# Patient Record
Sex: Female | Born: 1980 | Race: White | Hispanic: No | Marital: Married | Smoking: Never smoker
Health system: Southern US, Community
[De-identification: ages and names within clinical notes are randomized; demographics above are authoritative.]

## PROBLEM LIST (undated history)

## (undated) DIAGNOSIS — F5089 Other specified eating disorder: Secondary | ICD-10-CM

## (undated) DIAGNOSIS — O99019 Anemia complicating pregnancy, unspecified trimester: Secondary | ICD-10-CM

## (undated) HISTORY — PX: WISDOM TOOTH EXTRACTION: SHX21

---

## 2002-01-28 ENCOUNTER — Other Ambulatory Visit: Admission: RE | Admit: 2002-01-28 | Discharge: 2002-01-28 | Payer: Self-pay | Admitting: Obstetrics and Gynecology

## 2003-03-03 ENCOUNTER — Other Ambulatory Visit: Admission: RE | Admit: 2003-03-03 | Discharge: 2003-03-03 | Payer: Self-pay | Admitting: Obstetrics and Gynecology

## 2012-05-02 DIAGNOSIS — O99019 Anemia complicating pregnancy, unspecified trimester: Secondary | ICD-10-CM

## 2012-05-02 HISTORY — DX: Anemia complicating pregnancy, unspecified trimester: O99.019

## 2012-07-12 LAB — OB RESULTS CONSOLE ANTIBODY SCREEN: Antibody Screen: NEGATIVE

## 2012-07-12 LAB — OB RESULTS CONSOLE ABO/RH

## 2012-07-12 LAB — OB RESULTS CONSOLE GC/CHLAMYDIA: Gonorrhea: NEGATIVE

## 2012-10-13 ENCOUNTER — Inpatient Hospital Stay (HOSPITAL_COMMUNITY): Admission: AD | Admit: 2012-10-13 | Payer: Self-pay | Source: Ambulatory Visit | Admitting: Obstetrics and Gynecology

## 2013-01-31 ENCOUNTER — Inpatient Hospital Stay (HOSPITAL_COMMUNITY)
Admission: AD | Admit: 2013-01-31 | Discharge: 2013-01-31 | Disposition: A | Discharge status: Home / Self Care | Payer: BC Managed Care – PPO | Source: Ambulatory Visit | Attending: Obstetrics and Gynecology | Admitting: Obstetrics and Gynecology

## 2013-01-31 ENCOUNTER — Encounter (HOSPITAL_COMMUNITY): Payer: Self-pay | Admitting: Anesthesiology

## 2013-01-31 ENCOUNTER — Inpatient Hospital Stay (HOSPITAL_COMMUNITY)
Admission: AD | Admit: 2013-01-31 | Discharge: 2013-02-02 | DRG: 370 | Disposition: A | Discharge status: Home / Self Care | Payer: BC Managed Care – PPO | Source: Ambulatory Visit | Attending: Obstetrics and Gynecology | Admitting: Obstetrics and Gynecology

## 2013-01-31 ENCOUNTER — Encounter (HOSPITAL_COMMUNITY): Payer: Self-pay | Admitting: *Deleted

## 2013-01-31 ENCOUNTER — Inpatient Hospital Stay (HOSPITAL_COMMUNITY): Payer: BC Managed Care – PPO | Admitting: Anesthesiology

## 2013-01-31 ENCOUNTER — Encounter (HOSPITAL_COMMUNITY)
Admission: AD | Disposition: A | Discharge status: Home / Self Care | Payer: Self-pay | Source: Ambulatory Visit | Attending: Obstetrics and Gynecology

## 2013-01-31 DIAGNOSIS — O321XX0 Maternal care for breech presentation, not applicable or unspecified: Principal | ICD-10-CM | POA: Diagnosis present

## 2013-01-31 DIAGNOSIS — D509 Iron deficiency anemia, unspecified: Secondary | ICD-10-CM | POA: Diagnosis present

## 2013-01-31 DIAGNOSIS — O43899 Other placental disorders, unspecified trimester: Secondary | ICD-10-CM | POA: Diagnosis present

## 2013-01-31 DIAGNOSIS — Z22330 Carrier of Group B streptococcus: Secondary | ICD-10-CM

## 2013-01-31 DIAGNOSIS — O479 False labor, unspecified: Secondary | ICD-10-CM | POA: Insufficient documentation

## 2013-01-31 DIAGNOSIS — O34599 Maternal care for other abnormalities of gravid uterus, unspecified trimester: Secondary | ICD-10-CM | POA: Diagnosis present

## 2013-01-31 DIAGNOSIS — D4959 Neoplasm of unspecified behavior of other genitourinary organ: Secondary | ICD-10-CM | POA: Diagnosis present

## 2013-01-31 DIAGNOSIS — O9902 Anemia complicating childbirth: Secondary | ICD-10-CM | POA: Diagnosis present

## 2013-01-31 DIAGNOSIS — D252 Subserosal leiomyoma of uterus: Secondary | ICD-10-CM | POA: Diagnosis present

## 2013-01-31 DIAGNOSIS — O471 False labor at or after 37 completed weeks of gestation: Secondary | ICD-10-CM

## 2013-01-31 DIAGNOSIS — O99892 Other specified diseases and conditions complicating childbirth: Secondary | ICD-10-CM | POA: Diagnosis present

## 2013-01-31 HISTORY — DX: Other specified eating disorder: F50.89

## 2013-01-31 HISTORY — DX: Anemia complicating pregnancy, unspecified trimester: O99.019

## 2013-01-31 LAB — CBC
HCT: 35.4 % — ABNORMAL LOW (ref 36.0–46.0)
Hemoglobin: 11.7 g/dL — ABNORMAL LOW (ref 12.0–15.0)
MCH: 27.7 pg (ref 26.0–34.0)
MCH: 27.6 pg (ref 26.0–34.0)
MCHC: 33.3 g/dL (ref 30.0–36.0)
MCHC: 33.1 g/dL (ref 30.0–36.0)
MCV: 83.2 fL (ref 78.0–100.0)
MCV: 83.5 fL (ref 78.0–100.0)
Platelets: 279 10*3/uL (ref 150–400)
RBC: 4.04 MIL/uL (ref 3.87–5.11)
RBC: 4.24 MIL/uL (ref 3.87–5.11)
RDW: 16.6 % — ABNORMAL HIGH (ref 11.5–15.5)
RDW: 16.8 % — ABNORMAL HIGH (ref 11.5–15.5)
WBC: 14.4 10*3/uL — ABNORMAL HIGH (ref 4.0–10.5)
WBC: 17.6 10*3/uL — ABNORMAL HIGH (ref 4.0–10.5)

## 2013-01-31 LAB — COMPREHENSIVE METABOLIC PANEL
AST: 21 U/L (ref 0–37)
CO2: 22 mEq/L (ref 19–32)
Calcium: 9.5 mg/dL (ref 8.4–10.5)
Creatinine, Ser: 0.91 mg/dL (ref 0.50–1.10)
GFR calc Af Amer: >90 mL/min (ref >90)
GFR calc non Af Amer: 81 mL/min — ABNORMAL LOW (ref >90)
Total Protein: 6.2 g/dL (ref 6.0–8.3)

## 2013-01-31 LAB — URIC ACID: Uric Acid, Serum: 5.7 mg/dL (ref 2.4–7.0)

## 2013-01-31 LAB — RPR: RPR Ser Ql: NONREACTIVE

## 2013-01-31 SURGERY — Surgical Case
Anesthesia: Spinal | Site: Abdomen | Wound class: Clean Contaminated

## 2013-01-31 MED ORDER — EPHEDRINE 5 MG/ML INJ: 10.0000 mg | INTRAVENOUS | Status: DC | PRN

## 2013-01-31 MED ORDER — DIPHENHYDRAMINE HCL 25 MG PO CAPS
25.0000 mg | ORAL_CAPSULE | ORAL | Status: DC | PRN
  Filled 2013-01-31: qty 1

## 2013-01-31 MED ORDER — FENTANYL 2.5 MCG/ML BUPIVACAINE 1/10 % EPIDURAL INFUSION (WH - ANES)
14.0000 mL/h | INTRAMUSCULAR | Status: DC | PRN
  Filled 2013-01-31: qty 125

## 2013-01-31 MED ORDER — DIBUCAINE 1 % RE OINT
1.0000 "application " | TOPICAL_OINTMENT | RECTAL | Status: DC | PRN
  Filled 2013-01-31: qty 28

## 2013-01-31 MED ORDER — LANOLIN HYDROUS EX OINT: 1.0000 "application " | TOPICAL_OINTMENT | CUTANEOUS | Status: DC | PRN

## 2013-01-31 MED ORDER — METOCLOPRAMIDE HCL 5 MG/ML IJ SOLN: 10.0000 mg | Freq: Three times a day (TID) | INTRAMUSCULAR | Status: DC | PRN

## 2013-01-31 MED ORDER — ONDANSETRON HCL 4 MG/2ML IJ SOLN: 4.0000 mg | Freq: Three times a day (TID) | INTRAMUSCULAR | Status: DC | PRN

## 2013-01-31 MED ORDER — DIPHENHYDRAMINE HCL 50 MG/ML IJ SOLN: 25.0000 mg | INTRAMUSCULAR | Status: DC | PRN

## 2013-01-31 MED ORDER — EPHEDRINE SULFATE 50 MG/ML IJ SOLN
INTRAMUSCULAR | Status: DC | PRN
  Administered 2013-01-31: 15 mg via INTRAVENOUS
  Administered 2013-01-31: 10 mg via INTRAVENOUS

## 2013-01-31 MED ORDER — FENTANYL CITRATE 0.05 MG/ML IJ SOLN: 25.0000 ug | INTRAMUSCULAR | Status: DC | PRN

## 2013-01-31 MED ORDER — LIDOCAINE-EPINEPHRINE (PF) 2 %-1:200000 IJ SOLN
INTRAMUSCULAR | Status: DC | PRN
  Administered 2013-01-31: 5 mL via EPIDURAL
  Administered 2013-01-31: 4 mL via EPIDURAL
  Administered 2013-01-31 (×2): 5 mL via EPIDURAL

## 2013-01-31 MED ORDER — SCOPOLAMINE 1 MG/3DAYS TD PT72
1.0000 | MEDICATED_PATCH | Freq: Once | TRANSDERMAL | Status: DC
  Administered 2013-01-31: 1.5 mg via TRANSDERMAL

## 2013-01-31 MED ORDER — SCOPOLAMINE 1 MG/3DAYS TD PT72
MEDICATED_PATCH | TRANSDERMAL | Status: AC
  Administered 2013-01-31: 1.5 mg via TRANSDERMAL
  Filled 2013-01-31: qty 1

## 2013-01-31 MED ORDER — NALBUPHINE HCL 10 MG/ML IJ SOLN
5.0000 mg | INTRAMUSCULAR | Status: DC | PRN
  Filled 2013-01-31: qty 1

## 2013-01-31 MED ORDER — PHENYLEPHRINE 40 MCG/ML (10ML) SYRINGE FOR IV PUSH (FOR BLOOD PRESSURE SUPPORT): 80.0000 ug | PREFILLED_SYRINGE | INTRAVENOUS | Status: DC | PRN

## 2013-01-31 MED ORDER — KETOROLAC TROMETHAMINE 30 MG/ML IJ SOLN
30.0000 mg | Freq: Four times a day (QID) | INTRAMUSCULAR | Status: AC | PRN
  Administered 2013-01-31: 30 mg via INTRAVENOUS

## 2013-01-31 MED ORDER — DIPHENHYDRAMINE HCL 50 MG/ML IJ SOLN: 12.5000 mg | INTRAMUSCULAR | Status: DC | PRN

## 2013-01-31 MED ORDER — MEPERIDINE HCL 25 MG/ML IJ SOLN: 6.2500 mg | INTRAMUSCULAR | Status: DC | PRN

## 2013-01-31 MED ORDER — MENTHOL 3 MG MT LOZG
1.0000 | LOZENGE | OROMUCOSAL | Status: DC | PRN
  Filled 2013-01-31: qty 9

## 2013-01-31 MED ORDER — WITCH HAZEL-GLYCERIN EX PADS: 1.0000 "application " | MEDICATED_PAD | CUTANEOUS | Status: DC | PRN

## 2013-01-31 MED ORDER — LACTATED RINGERS IV SOLN
INTRAVENOUS | Status: DC
  Administered 2013-01-31: 11:00:00 via INTRAVENOUS

## 2013-01-31 MED ORDER — PENICILLIN G POTASSIUM 5000000 UNITS IJ SOLR
2.5000 10*6.[IU] | INTRAVENOUS | Status: DC
  Filled 2013-01-31 (×4): qty 2.5

## 2013-01-31 MED ORDER — SIMETHICONE 80 MG PO CHEW
80.0000 mg | CHEWABLE_TABLET | Freq: Three times a day (TID) | ORAL | Status: DC
  Administered 2013-02-01 – 2013-02-02 (×6): 80 mg via ORAL

## 2013-01-31 MED ORDER — OXYTOCIN BOLUS FROM INFUSION: 500.0000 mL | INTRAVENOUS | Status: DC

## 2013-01-31 MED ORDER — LACTATED RINGERS IV SOLN
INTRAVENOUS | Status: DC | PRN
  Administered 2013-01-31 (×2): via INTRAVENOUS

## 2013-01-31 MED ORDER — KETOROLAC TROMETHAMINE 30 MG/ML IJ SOLN
INTRAMUSCULAR | Status: AC
  Administered 2013-01-31: 30 mg via INTRAVENOUS
  Filled 2013-01-31: qty 1

## 2013-01-31 MED ORDER — SIMETHICONE 80 MG PO CHEW: 80.0000 mg | CHEWABLE_TABLET | ORAL | Status: DC

## 2013-01-31 MED ORDER — OXYTOCIN 40 UNITS IN LACTATED RINGERS INFUSION - SIMPLE MED: 62.5000 mL/h | INTRAVENOUS | Status: DC

## 2013-01-31 MED ORDER — PROMETHAZINE HCL 25 MG/ML IJ SOLN
12.5000 mg | Freq: Once | INTRAMUSCULAR | Status: AC
  Administered 2013-01-31: 12.5 mg via INTRAVENOUS
  Filled 2013-01-31: qty 1

## 2013-01-31 MED ORDER — IBUPROFEN 600 MG PO TABS: 600.0000 mg | ORAL_TABLET | Freq: Four times a day (QID) | ORAL | Status: DC | PRN

## 2013-01-31 MED ORDER — DIPHENHYDRAMINE HCL 25 MG PO CAPS
25.0000 mg | ORAL_CAPSULE | Freq: Four times a day (QID) | ORAL | Status: DC | PRN
  Filled 2013-01-31: qty 1

## 2013-01-31 MED ORDER — ONDANSETRON HCL 4 MG PO TABS: 4.0000 mg | ORAL_TABLET | ORAL | Status: DC | PRN

## 2013-01-31 MED ORDER — CITRIC ACID-SODIUM CITRATE 334-500 MG/5ML PO SOLN
30.0000 mL | ORAL | Status: DC | PRN
  Administered 2013-01-31: 30 mL via ORAL
  Filled 2013-01-31: qty 15

## 2013-01-31 MED ORDER — LACTATED RINGERS IV SOLN: 500.0000 mL | INTRAVENOUS | Status: DC | PRN

## 2013-01-31 MED ORDER — CEFAZOLIN SODIUM-DEXTROSE 2-3 GM-% IV SOLR
2.0000 g | INTRAVENOUS | Status: AC
  Administered 2013-01-31: 2 g via INTRAVENOUS
  Filled 2013-01-31: qty 50

## 2013-01-31 MED ORDER — EPHEDRINE 5 MG/ML INJ
10.0000 mg | INTRAVENOUS | Status: DC | PRN
  Filled 2013-01-31: qty 4

## 2013-01-31 MED ORDER — LACTATED RINGERS IV SOLN: INTRAVENOUS | Status: DC

## 2013-01-31 MED ORDER — 0.9 % SODIUM CHLORIDE (POUR BTL) OPTIME
TOPICAL | Status: DC | PRN
  Administered 2013-01-31: 1000 mL

## 2013-01-31 MED ORDER — OXYCODONE-ACETAMINOPHEN 5-325 MG PO TABS: 1.0000 | ORAL_TABLET | ORAL | Status: DC | PRN

## 2013-01-31 MED ORDER — ONDANSETRON HCL 4 MG/2ML IJ SOLN
INTRAMUSCULAR | Status: DC | PRN
  Administered 2013-01-31: 4 mg via INTRAVENOUS

## 2013-01-31 MED ORDER — PHENYLEPHRINE 40 MCG/ML (10ML) SYRINGE FOR IV PUSH (FOR BLOOD PRESSURE SUPPORT)
80.0000 ug | PREFILLED_SYRINGE | INTRAVENOUS | Status: DC | PRN
  Filled 2013-01-31: qty 5

## 2013-01-31 MED ORDER — IBUPROFEN 600 MG PO TABS
600.0000 mg | ORAL_TABLET | Freq: Four times a day (QID) | ORAL | Status: DC
  Administered 2013-02-01 – 2013-02-02 (×7): 600 mg via ORAL
  Filled 2013-01-31 (×7): qty 1

## 2013-01-31 MED ORDER — ONDANSETRON HCL 4 MG/2ML IJ SOLN: 4.0000 mg | INTRAMUSCULAR | Status: DC | PRN

## 2013-01-31 MED ORDER — PRENATAL MULTIVITAMIN CH
1.0000 | ORAL_TABLET | Freq: Every day | ORAL | Status: DC
  Administered 2013-02-01 – 2013-02-02 (×2): 1 via ORAL
  Filled 2013-01-31 (×3): qty 1

## 2013-01-31 MED ORDER — MORPHINE SULFATE 0.5 MG/ML IJ SOLN
INTRAMUSCULAR | Status: AC
  Filled 2013-01-31: qty 10

## 2013-01-31 MED ORDER — OXYTOCIN 40 UNITS IN LACTATED RINGERS INFUSION - SIMPLE MED: 62.5000 mL/h | INTRAVENOUS | Status: AC

## 2013-01-31 MED ORDER — LACTATED RINGERS IV SOLN
500.0000 mL | Freq: Once | INTRAVENOUS | Status: AC
  Administered 2013-01-31: 500 mL via INTRAVENOUS

## 2013-01-31 MED ORDER — NALOXONE HCL 1 MG/ML IJ SOLN: 1.0000 ug/kg/h | INTRAVENOUS | Status: DC | PRN

## 2013-01-31 MED ORDER — ZOLPIDEM TARTRATE 5 MG PO TABS: 5.0000 mg | ORAL_TABLET | Freq: Every evening | ORAL | Status: DC | PRN

## 2013-01-31 MED ORDER — BUTORPHANOL TARTRATE 1 MG/ML IJ SOLN
2.0000 mg | Freq: Once | INTRAMUSCULAR | Status: AC
  Administered 2013-01-31: 2 mg via INTRAVENOUS
  Filled 2013-01-31: qty 2

## 2013-01-31 MED ORDER — SENNOSIDES-DOCUSATE SODIUM 8.6-50 MG PO TABS
2.0000 | ORAL_TABLET | ORAL | Status: DC
  Administered 2013-02-02: 2 via ORAL

## 2013-01-31 MED ORDER — MORPHINE SULFATE (PF) 0.5 MG/ML IJ SOLN
INTRAMUSCULAR | Status: DC | PRN
  Administered 2013-01-31: 4 mg via EPIDURAL

## 2013-01-31 MED ORDER — ACETAMINOPHEN 325 MG PO TABS: 650.0000 mg | ORAL_TABLET | ORAL | Status: DC | PRN

## 2013-01-31 MED ORDER — NALOXONE HCL 0.4 MG/ML IJ SOLN: 0.4000 mg | INTRAMUSCULAR | Status: DC | PRN

## 2013-01-31 MED ORDER — LIDOCAINE HCL (PF) 1 % IJ SOLN: 30.0000 mL | INTRAMUSCULAR | Status: DC | PRN

## 2013-01-31 MED ORDER — ONDANSETRON HCL 4 MG/2ML IJ SOLN: 4.0000 mg | Freq: Four times a day (QID) | INTRAMUSCULAR | Status: DC | PRN

## 2013-01-31 MED ORDER — LACTATED RINGERS IV SOLN
INTRAVENOUS | Status: DC | PRN
  Administered 2013-01-31: 17:00:00 via INTRAVENOUS

## 2013-01-31 MED ORDER — SODIUM CHLORIDE 0.9 % IJ SOLN: 3.0000 mL | INTRAMUSCULAR | Status: DC | PRN

## 2013-01-31 MED ORDER — KETOROLAC TROMETHAMINE 30 MG/ML IJ SOLN: 30.0000 mg | Freq: Four times a day (QID) | INTRAMUSCULAR | Status: AC | PRN

## 2013-01-31 MED ORDER — SIMETHICONE 80 MG PO CHEW: 80.0000 mg | CHEWABLE_TABLET | ORAL | Status: DC | PRN

## 2013-01-31 MED ORDER — OXYTOCIN 10 UNIT/ML IJ SOLN
40.0000 [IU] | INTRAVENOUS | Status: DC | PRN
  Administered 2013-01-31: 40 [IU] via INTRAVENOUS

## 2013-01-31 MED ORDER — PENICILLIN G POTASSIUM 5000000 UNITS IJ SOLR
5.0000 10*6.[IU] | Freq: Once | INTRAVENOUS | Status: AC
  Administered 2013-01-31: 5 10*6.[IU] via INTRAVENOUS
  Filled 2013-01-31: qty 5

## 2013-01-31 MED ORDER — TETANUS-DIPHTH-ACELL PERTUSSIS 5-2.5-18.5 LF-MCG/0.5 IM SUSP
0.5000 mL | Freq: Once | INTRAMUSCULAR | Status: DC
  Filled 2013-01-31: qty 0.5

## 2013-01-31 MED ORDER — MORPHINE SULFATE (PF) 0.5 MG/ML IJ SOLN
INTRAMUSCULAR | Status: DC | PRN
  Administered 2013-01-31: 1 mg via INTRAVENOUS

## 2013-01-31 SURGICAL SUPPLY — 34 items
BENZOIN TINCTURE PRP APPL 2/3 (GAUZE/BANDAGES/DRESSINGS) ×2 IMPLANT
CLAMP CORD UMBIL (MISCELLANEOUS) IMPLANT
CLOTH BEACON ORANGE TIMEOUT ST (SAFETY) ×2 IMPLANT
CONTAINER PREFILL 10% NBF 15ML (MISCELLANEOUS) IMPLANT
DRAPE LG THREE QUARTER DISP (DRAPES) ×4 IMPLANT
DRSG OPSITE POSTOP 4X10 (GAUZE/BANDAGES/DRESSINGS) ×2 IMPLANT
DURAPREP 26ML APPLICATOR (WOUND CARE) ×2 IMPLANT
ELECT REM PT RETURN 9FT ADLT (ELECTROSURGICAL) ×2
ELECTRODE REM PT RTRN 9FT ADLT (ELECTROSURGICAL) ×1 IMPLANT
EXTRACTOR VACUUM KIWI (MISCELLANEOUS) IMPLANT
EXTRACTOR VACUUM M CUP 4 TUBE (SUCTIONS) IMPLANT
GLOVE BIO SURGEON STRL SZ 6.5 (GLOVE) ×2 IMPLANT
GLOVE BIOGEL PI IND STRL 7.0 (GLOVE) ×1 IMPLANT
GLOVE BIOGEL PI INDICATOR 7.0 (GLOVE) ×1
GOWN PREVENTION PLUS XLARGE (GOWN DISPOSABLE) ×4 IMPLANT
GOWN STRL REIN XL XLG (GOWN DISPOSABLE) ×4 IMPLANT
KIT ABG SYR 3ML LUER SLIP (SYRINGE) IMPLANT
NEEDLE HYPO 25X5/8 SAFETYGLIDE (NEEDLE) IMPLANT
NS IRRIG 1000ML POUR BTL (IV SOLUTION) ×2 IMPLANT
PACK C SECTION WH (CUSTOM PROCEDURE TRAY) ×2 IMPLANT
PAD OB MATERNITY 4.3X12.25 (PERSONAL CARE ITEMS) ×2 IMPLANT
STAPLER VISISTAT 35W (STAPLE) IMPLANT
STRIP CLOSURE SKIN 1/2X4 (GAUZE/BANDAGES/DRESSINGS) ×2 IMPLANT
SUT MON AB 4-0 PS1 27 (SUTURE) ×2 IMPLANT
SUT PLAIN 0 NONE (SUTURE) IMPLANT
SUT PLAIN 2 0 XLH (SUTURE) IMPLANT
SUT VIC AB 0 CT1 36 (SUTURE) ×2 IMPLANT
SUT VIC AB 0 CTX 36 (SUTURE) ×2
SUT VIC AB 0 CTX36XBRD ANBCTRL (SUTURE) ×2 IMPLANT
SUT VIC AB 2-0 CT1 27 (SUTURE) ×1
SUT VIC AB 2-0 CT1 TAPERPNT 27 (SUTURE) ×1 IMPLANT
TOWEL OR 17X24 6PK STRL BLUE (TOWEL DISPOSABLE) ×2 IMPLANT
TRAY FOLEY CATH 14FR (SET/KITS/TRAYS/PACK) IMPLANT
WATER STERILE IRR 1000ML POUR (IV SOLUTION) ×2 IMPLANT

## 2013-01-31 NOTE — H&P (Signed)
  OB ADMISSION/ HISTORY & PHYSICAL:  Admission Date: 01/31/2013 10:34 AM  Admit Diagnosis: onset of labor - latent labor  Elle Vezina is a 32 y.o. female presenting for regular contractions all night - increased intensity this am. Seen x 2 in MAU overnight - no sleep / + nausea.  Prenatal History: G1P0000   EDC : 01/30/2013, Alternate EDD Entry  Prenatal care at [facility]  Primary Ob Provider: Marlinda Mike CNM Prenatal course complicated by IDA of pregnancy  Prenatal Labs: ABO, Rh:  O positive Antibody:  negative Rubella:   Immune RPR:   NR HBsAg:   Negative HIV:   NR GTT: NL GBS: Positive (10/02 0000)   Medical / Surgical History :  Past medical history:  Past Medical History  Diagnosis Date  . Anemia during pregnancy 2014     Past surgical history:  Past Surgical History  Procedure Laterality Date  . No past surgeries      Family History: No family history on file.   Social History:  reports that she has never smoked. She does not have any smokeless tobacco history on file. She reports that she does not drink alcohol or use illicit drugs.  Allergies: Review of patient's allergies indicates no known allergies.   Current Medications at time of admission:  Prior to Admission medications   Medication Sig Start Date End Date Taking? Authorizing Provider  Cholecalciferol (VITAMIN D PO) Take 1 tablet by mouth.    Historical Provider, MD  Iron Polysacch Cmplx-B12-FA (POLY-IRON 150 FORTE PO) Take 1 tablet by mouth.    Historical Provider, MD  MAGNESIUM OXIDE PO Take 1 tablet by mouth.    Historical Provider, MD  Prenatal Vit-Fe Fumarate-FA (PRENATAL MULTIVITAMIN) TABS tablet Take 1 tablet by mouth daily at 12 noon.    Historical Provider, MD    Review of Systems: Active FM onset of ctx yesterday and continued all night -  currently every 5-6 minutes SROM upon arrival to BS bloody show small brown mucus  Physical Exam:  VS: 98.5 -  63-20-138/70  General: alert and oriented, appears uncomfortable with ctx - breathing with ctx Heart: RRR Lungs: Clear lung fields Abdomen: Gravid, soft and non-tender, non-distended / uterus: gravid and non-tender Extremities: no  edema  Genitalia / VE:  3cm / 90% / vtx / 0 station / BBOW (at office)  FHR: baseline rate 135 / variability moderate / accelerations + / no decelerations TOCO: 5 minutes  Assessment: 40.[redacted] weeks gestation latent stage of labor with POS GBS FHR category 1  Plan:  Admit PCN protocol for GBS IV analgesia - stadol 2mg  and phenergan 12.5 Encouraged rest/sleep for 1-2 hours then will recheck status  Dr Ernestina Penna notified of admission / plan of care   Marlinda Mike CNM, MSN, [facility] 01/31/2013, 10:45 AM

## 2013-01-31 NOTE — MAU Note (Signed)
contractions 

## 2013-01-31 NOTE — Progress Notes (Signed)
  S:  Rested since IV analgesia - comfortable now  O:  VS: Blood pressure 115/54, pulse 56, temperature 98.2 F (36.8 C), temperature source Oral, resp. rate 20, height 5\' 6"  (1.676 m), weight 83.915 kg (185 lb).        FHR : baseline 135 / variability moderate / accelerations + / no decelerations        Toco: contractions every 6-9 minutes / moderrate         Cervix : 3/ 90% / not cephalic - question frank breech / 0 station         Membranes: light meconium        Bedside sono: breech with cephalic prominence in right upper quadrant - left sacral anterior                                cardiac activity right upper above umbilicus / subjectively low AF volume  A: latent labor     FHR category 1  P: consult with Dr Ernestina Penna - breech in latent labor      Plan for cesarean delivery   Erika Jennings CNM, MSN, [facility] 01/31/2013, 2:38 PM

## 2013-01-31 NOTE — Transfer of Care (Signed)
Immediate Anesthesia Transfer of Care Note  Patient: Erika Jennings  Procedure(s) Performed: Procedure(s): CESAREAN SECTION (N/A)  Patient Location: PACU  Anesthesia Type:Epidural  Level of Consciousness: awake, alert  and oriented  Airway & Oxygen Therapy: Patient Spontanous Breathing  Post-op Assessment: Report given to PACU RN and Post -op Vital signs reviewed and stable  Post vital signs: Reviewed and stable  Complications: No apparent anesthesia complications

## 2013-01-31 NOTE — Anesthesia Procedure Notes (Signed)
Epidural Patient location during procedure: OB Start time: 01/31/2013 3:44 PM  Staffing Anesthesiologist: Jakobe Blau A. Performed by: anesthesiologist   Preanesthetic Checklist Completed: patient identified, site marked, surgical consent, pre-op evaluation, timeout performed, IV checked, risks and benefits discussed and monitors and equipment checked  Epidural Patient position: sitting Prep: site prepped and draped and DuraPrep Patient monitoring: continuous pulse ox and blood pressure Approach: midline Injection technique: LOR air  Needle:  Needle type: Tuohy  Needle gauge: 17 G Needle length: 9 cm and 9 Needle insertion depth: 4 cm Catheter type: closed end flexible Catheter size: 19 Gauge Catheter at skin depth: 9 cm Test dose: negative and 2% lidocaine with Epi 1:200 K  Assessment Events: blood not aspirated, injection not painful, no injection resistance, negative IV test and no paresthesia  Additional Notes Patient identified. Risks and benefits discussed including failed block, incomplete  Pain control, post dural puncture headache, nerve damage, paralysis, blood pressure Changes, nausea, vomiting, reactions to medications-both toxic and allergic and post Partum back pain. All questions were answered. Patient expressed understanding and wished to proceed. Sterile technique was used throughout procedure. Epidural site was Dressed with sterile barrier dressing. No paresthesias, signs of intravascular injection Or signs of intrathecal spread were encountered.  Patient was more comfortable after the epidural was dosed. Please see RN's note for documentation of vital signs and FHR which are stable.

## 2013-01-31 NOTE — Op Note (Signed)
01/31/2013  5:23 PM  PATIENT:  Erika Jennings  32 y.o. female  PRE-OPERATIVE DIAGNOSIS:  breech in labor  POST-OPERATIVE DIAGNOSIS:  breech in labor   PROCEDURE:  Procedure(s): CESAREAN SECTION (N/A) LTCS w/ 2 layer closure  SURGEON:  Surgeon(s) and Role:    * Lynnann Knudsen A. Ernestina Penna, MD - Primary  PHYSICIAN ASSISTANT:   ASSISTANTS: Marlinda Mike, CNM   ANESTHESIA:   epidural  EBL:  Total I/O In: 800 [I.V.:800] Out: 750 [Urine:150; Blood:600]  BLOOD ADMINISTERED:none  DRAINS: Urinary Catheter (Foley)   LOCAL MEDICATIONS USED:  NONE  SPECIMEN:  Source of Specimen:  placenta  DISPOSITION OF SPECIMEN:  L&D  COUNTS:  YES  TOURNIQUET:  * No tourniquets in log *  DICTATION: .Note written in EPIC  PLAN OF CARE: Admit to inpatient   PATIENT DISPOSITION:  PACU - hemodynamically stable.   Delay start of Pharmacological VTE agent (>24hrs) due to surgical blood loss or risk of bleeding: yes   Findings:  @BABYSEXEBC @ infant,  APGAR (1 MIN): 5   APGAR (5 MINS): 9   APGAR (10 MINS):    Footling breech, thick meconium, bilobed placenta w/ calcifications, 2 cm subserosal anterior placenta, nl uterus, nl tubes, ovaries  EBL: per anasthesia Antibiotics:  2g Ancef  Complications: none  Indications: This is a 32 y.o. year-old, G1  At [redacted]w[redacted]d admitted for labor. SROM while in labor. After SROM, repeat cervical exam suspicous for breech. RTUS confirmed breech presentation and decision made to proceed to OR for PCS. Risks benefits and alternatives of the procedure were discussed with the patient who agreed to proceed  Procedure:  After informed consent was obtained the patient was taken to the operating room where epidural anesthesia was found to be adequate.  She was prepped and draped in the normal sterile fashion in dorsal supine position with a leftward tilt.  A foley catheter was in place.  A Pfannenstiel skin incision was made 2 cm above the pubic symphysis in the midline with the  scalpel.  Dissection was carried down with the Bovie cautery until the fascia was reached. The fascia was incised in the midline. The incision was extended laterally with the Mayo scissors. The inferior aspect of the fascial incision was grasped with the Coker clamps, elevated up and the underlying rectus muscles were dissected off sharply. The superior aspect of the fascial incision was grasped with the Coker clamps elevated up and the underlying rectus muscles were dissected off sharply.  The peritoneum was entered bluntly. The peritoneal incision was extended superiorly and inferiorly with good visualization of the bladder. The bladder blade was inserted and palpation was done to assess the fetal position and the location of the uterine vessels. The lower segment of the uterus was incised sharply with the scalpel and extended superiorly and laterally with the bandage scissors. The infant's foot was grasped, brought to the incision. Second foot grasped and baby elevated, delivered to shoulders. L shoulder delivered, baby rotated and R shoulder delivered by sweeping arm across chest. Head delivered in flexion.  The nose and mouth were bulb suctioned. Thick meconium noted and cord was clamped and cut and infant was handed off to the waiting pediatrician for suctioning. The placenta was expressed. The uterus was exteriorized. The uterus was cleared of all clots and debris. The uterine incision was repaired with 0 Vicryl in a running locked fashion.  A second layer of the same suture was used in an imbricating fashion to obtain excellent hemostasis.  The  uterus was then returned to the abdomen, the gutters were cleared of all clots and debris. The uterine incision was reinspected and found to be hemostatic. The peritoneum was grasped and closed with 2-0 Vicryl in a running fashion. The cut muscle edges and the underside of the fascia were inspected and found to be hemostatic. The fascia was closed with 0 Vicryl in two  halves . The subcutaneous tissue was irrigated. Scarpa's layer was closed with a 2-0 plain gut suture. The skin was closed with a 4-0 Monocryl in a single layer. The patient tolerated the procedure well. Sponge lap and needle counts were correct x3 and patient was taken to the recovery room in a stable condition.  Georgeanna Radziewicz A. 01/31/2013 5:24 PM

## 2013-01-31 NOTE — Anesthesia Preprocedure Evaluation (Signed)
Anesthesia Evaluation  Patient identified by MRN, date of birth, ID band Patient awake    Reviewed: Allergy & Precautions, H&P , NPO status , Patient's Chart, lab work & pertinent test results  History of Anesthesia Complications (+) MALIGNANT HYPERTHERMIA  Airway Mallampati: III TM Distance: >3 FB Neck ROM: Full    Dental no notable dental hx. (+) Teeth Intact   Pulmonary neg pulmonary ROS,  breath sounds clear to auscultation  Pulmonary exam normal       Cardiovascular negative cardio ROS  Rhythm:Regular Rate:Normal     Neuro/Psych PSYCHIATRIC DISORDERS Pica negative neurological ROS     GI/Hepatic Neg liver ROS, GERD-  Medicated and Controlled,  Endo/Other  negative endocrine ROS  Renal/GU negative Renal ROS  negative genitourinary   Musculoskeletal negative musculoskeletal ROS (+)   Abdominal   Peds  Hematology negative hematology ROS (+)   Anesthesia Other Findings   Reproductive/Obstetrics (+) Pregnancy Breech presentation Term                           Anesthesia Physical Anesthesia Plan  ASA: II and emergent  Anesthesia Plan: Spinal   Post-op Pain Management:    Induction:   Airway Management Planned: Natural Airway  Additional Equipment:   Intra-op Plan:   Post-operative Plan:   Informed Consent: I have reviewed the patients History and Physical, chart, labs and discussed the procedure including the risks, benefits and alternatives for the proposed anesthesia with the patient or authorized representative who has indicated his/her understanding and acceptance.   Dental advisory given  Plan Discussed with: Anesthesiologist, CRNA and Surgeon  Anesthesia Plan Comments:         Anesthesia Quick Evaluation

## 2013-01-31 NOTE — Anesthesia Postprocedure Evaluation (Signed)
  Anesthesia Post-op Note  Patient: Erika Jennings  Procedure(s) Performed: Procedure(s): CESAREAN SECTION (N/A)  Patient Location: PACU  Anesthesia Type:Epidural  Level of Consciousness: awake, alert  and oriented  Airway and Oxygen Therapy: Patient Spontanous Breathing  Post-op Pain: none  Post-op Assessment: Post-op Vital signs reviewed, Patient's Cardiovascular Status Stable, Respiratory Function Stable, Patent Airway, No signs of Nausea or vomiting, Pain level controlled, No headache and No backache  Post-op Vital Signs: Reviewed and stable  Complications: No apparent anesthesia complications

## 2013-01-31 NOTE — Brief Op Note (Signed)
01/31/2013  5:23 PM  PATIENT:  Erika Jennings  32 y.o. female  PRE-OPERATIVE DIAGNOSIS:  breech in labor  POST-OPERATIVE DIAGNOSIS:  breech in labor   PROCEDURE:  Procedure(s): CESAREAN SECTION (N/A) LTCS w/ 2 layer closure  SURGEON:  Surgeon(s) and Role:    * Tyasia Packard A. Ernestina Penna, MD - Primary  PHYSICIAN ASSISTANT:   ASSISTANTS: Marlinda Mike, CNM   ANESTHESIA:   epidural  EBL:  Total I/O In: 800 [I.V.:800] Out: 750 [Urine:150; Blood:600]  BLOOD ADMINISTERED:none  DRAINS: Urinary Catheter (Foley)   LOCAL MEDICATIONS USED:  NONE  SPECIMEN:  Source of Specimen:  placenta  DISPOSITION OF SPECIMEN:  L&D  COUNTS:  YES  TOURNIQUET:  * No tourniquets in log *  DICTATION: .Note written in EPIC  PLAN OF CARE: Admit to inpatient   PATIENT DISPOSITION:  PACU - hemodynamically stable.   Delay start of Pharmacological VTE agent (>24hrs) due to surgical blood loss or risk of bleeding: yes

## 2013-01-31 NOTE — MAU Note (Signed)
Contractions every 4 minutes since 3am this morning. Some bloody discharge. Denies LOF. Positive fetal movement.

## 2013-02-01 ENCOUNTER — Encounter (HOSPITAL_COMMUNITY): Payer: Self-pay | Admitting: Obstetrics

## 2013-02-01 LAB — CBC
HCT: 24.5 % — ABNORMAL LOW (ref 36.0–46.0)
Hemoglobin: 8.2 g/dL — ABNORMAL LOW (ref 12.0–15.0)
MCHC: 33.5 g/dL (ref 30.0–36.0)
RBC: 2.9 MIL/uL — ABNORMAL LOW (ref 3.87–5.11)
WBC: 13.3 10*3/uL — ABNORMAL HIGH (ref 4.0–10.5)

## 2013-02-01 NOTE — Progress Notes (Signed)
Seen and agree -  Frady Taddeo CNM, MSN, FACNM 02/01/2013 @ 1006 am 

## 2013-02-01 NOTE — Lactation Note (Signed)
This note was copied from the chart of Erika Jennings. Lactation Consultation Note  Patient Name: Erika Jennings Date: 02/01/2013  Mom reports baby is nursing well, denies questions or concerns. Basics reviewed. Encouraged to continue to BF with feeding ques, at least every 3 hours. Lactation brochure left for review. Advised of OP services and support group. Advised to call for assist as needed.    Maternal Data    Feeding Feeding Type: Breast Milk  LATCH Score/Interventions Latch: Grasps breast easily, tongue down, lips flanged, rhythmical sucking.  Audible Swallowing: Spontaneous and intermittent  Type of Nipple: Everted at rest and after stimulation  Comfort (Breast/Nipple): Soft / non-tender     Hold (Positioning): No assistance needed to correctly position infant at breast.  LATCH Score: 10  Lactation Tools Discussed/Used     Consult Status      Erika Jennings 02/01/2013, 1:50 PM

## 2013-02-01 NOTE — Anesthesia Postprocedure Evaluation (Signed)
  Anesthesia Post-op Note  Patient: Erika Jennings  Procedure(s) Performed: Procedure(s): CESAREAN SECTION (N/A)  Patient Location: Mother/Baby  Anesthesia Type:Epidural  Level of Consciousness: awake, alert  and oriented  Airway and Oxygen Therapy: Patient Spontanous Breathing  Post-op Pain: mild  Post-op Assessment: Post-op Vital signs reviewed, Patient's Cardiovascular Status Stable, No headache, No backache, No residual numbness and No residual motor weakness  Post-op Vital Signs: Reviewed and stable  Complications: No apparent anesthesia complications

## 2013-02-01 NOTE — Progress Notes (Signed)
PPD # 1 s/p Primary LTCS for breech presentation  Subjective  Pt reports feeling well.  Pain controlled with Motrin.  Tolerating PO Catheter removed this AM.  Not yet voided.  No BM.  Passing flatus. Activity: Up ad lib and ambulatory Breastfeeding well   Objective  VS: T 97.28F BP 113/67 P 63 R18  Labs   10/2 10/2 10/3  0500 1125 0555 Hgb  11.2 11.7 8.2 Hct  33.6 35.4 24.5 Plts  258 279 223  WBC  14.4 17.6 13.3  I&O:  10/2 In 862.5 mL; Out 2400 mL  Net -1537.5 mL  Blood type: O pos  Physical Exam:  General: Alert, cooperative, no distress  CV: RRR  Resp: CTAB  Abdomen: Soft, nontender  Incision: Well approximated w/ suture.  No bleeding.  Tegaderm and honeycomb dressing clean and intact. Uterus: Firm U/U  Perineum: No edema Lochia: Scant rubra, no clots  Ext: +1 edema bilaterally, no erythema, no pain, Neg Homens   A/P:  1.) PPD 1 s/p Primrary LTCS  Continue routine PP orders  Anticipate d/c tomorrow or Sunday  2.) Anemia  Continue PNV & iron supplementation   Georga Bora, SNM

## 2013-02-02 MED ORDER — MEASLES, MUMPS & RUBELLA VAC ~~LOC~~ INJ
0.5000 mL | INJECTION | Freq: Once | SUBCUTANEOUS | Status: AC
  Administered 2013-02-02: 0.5 mL via SUBCUTANEOUS
  Filled 2013-02-02: qty 0.5

## 2013-02-02 MED ORDER — INFLUENZA VAC SPLIT QUAD 0.5 ML IM SUSP
0.5000 mL | Freq: Once | INTRAMUSCULAR | Status: AC
  Administered 2013-02-02: 0.5 mL via INTRAMUSCULAR
  Filled 2013-02-02: qty 0.5

## 2013-02-02 MED ORDER — INFLUENZA VAC SPLIT QUAD 0.5 ML IM SUSP: 0.5000 mL | INTRAMUSCULAR | Status: DC

## 2013-02-02 MED ORDER — IBUPROFEN 600 MG PO TABS: 600.0000 mg | ORAL_TABLET | Freq: Four times a day (QID) | ORAL | Status: DC

## 2013-02-02 NOTE — Discharge Summary (Signed)
POSTOPERATIVE DISCHARGE SUMMARY:  Patient ID: Erika Jennings MRN: [id number] DOB/AGE: 1981-01-28 32 y.o.  Admit date: 01/31/2013 Admission Diagnoses: 40 weeks / labor onset / breech  Discharge date:  02/02/2013 Discharge Diagnoses: POD 2 s/p cesarean section for breech in labor  Prenatal history: G1P1001   EDC : 01/30/2013, Alternate EDD Entry  Prenatal care at [facility]  Primary provider : Marlinda Mike CNm Prenatal course complicated by IDA with PICA  Prenatal Labs: ABO, Rh: --/--/O POS (10/03 0500)  Antibody: Negative (03/13 0000) Rubella:   indeterminate ( MMR booster given) RPR: NON REACTIVE (10/02 1125)  HBsAg: Negative (03/13 0000)  HIV: Non-reactive (03/13 0000)  GTT : NL GBS: Positive (10/02 0000)   Medical / Surgical History :  Past medical history:  Past Medical History  Diagnosis Date  . Anemia during pregnancy 2014  . Pica     Past surgical history:  Past Surgical History  Procedure Laterality Date  . Wisdom tooth extraction    . Cesarean section N/A 01/31/2013    Procedure: CESAREAN SECTION;  Surgeon: Tresa Endo A. Ernestina Penna, MD;  Location: [facility] OR[facility];  Service: Obstetrics;  Laterality: N/A;    Family History: History reviewed. No pertinent family history.  Social History:  reports that she has never smoked. She does not have any smokeless tobacco history on file. She reports that she does not drink alcohol or use illicit drugs.  Allergies: Review of patient's allergies indicates no known allergies.   Current Medications at time of admission:  Prior to Admission medications   Medication Sig Start Date End Date Taking? Authorizing Provider  Cholecalciferol (VITAMIN D PO) Take 2 tablets by mouth.    Yes Historical Provider, MD  Iron Polysacch Cmplx-B12-FA (POLY-IRON 150 FORTE PO) Take 1 tablet by mouth.   Yes Historical Provider, MD  MAGNESIUM OXIDE PO Take 1 tablet by mouth.   Yes Historical Provider, MD  Prenatal Vit-Fe Fumarate-FA (PRENATAL  MULTIVITAMIN) TABS tablet Take 2 tablets by mouth daily at 12 noon.    Yes Historical Provider, MD  ibuprofen (ADVIL,MOTRIN) 600 MG tablet Take 1 tablet (600 mg total) by mouth every 6 (six) hours. 02/02/13   Marlinda Mike, CNM     Intrapartum Course:  Admit for onset of labor with labor progression to 4cm dilation  Breech presentation identified with SROM  Pain management: IV analgesia  CS delivery recommended  Procedures: Cesarean section delivery on 01/31/2013 with delivery of  female newborn by Dr Ernestina Penna   See operative report for further details APGAR (1 MIN): 5   APGAR (5 MINS): 9    Postoperative / postpartum course:  Uncomplicated with discharge on POD 2   Physical Exam:   VSS: Temp:  [97.3 F (36.3 C)-97.6 F (36.4 C)] 97.5 F (36.4 C) (10/04 0600) Pulse Rate:  [54-60] 57 (10/04 0600) Resp:  [16-18] 18 (10/04 0600) BP: (118-135)/(66-72) 135/72 mmHg (10/04 0600) SpO2:  [98 %] 98 % (10/03 1618)  LABS:  Recent Labs  01/31/13 1125 02/01/13 0555  WBC 17.6* 13.3*  HGB 11.7* 8.2*  PLT 279 223    General: ambulatory / NAD or pain Heart: RRR Lungs: clear  Abdomen: soft and non-tender / non-distended / active BS  Extremities: edema / neg Homans  Dressing: intact honeycomb dressing Incision:  approximated with subcuticular / no erythema / no ecchymosis / no drainage  Discharge Instructions:  Discharged Condition: stable  Activity: pelvic rest and postoperative restrictions x 2   Diet: routine  Medications:  Medication List         ibuprofen 600 MG tablet  Commonly known as:  ADVIL,MOTRIN  Take 1 tablet (600 mg total) by mouth every 6 (six) hours.     MAGNESIUM OXIDE PO  Take 1 tablet by mouth.     POLY-IRON 150 FORTE PO  Take 1 tablet by mouth.     prenatal multivitamin Tabs tablet  Take 2 tablets by mouth daily at 12 noon.     VITAMIN D PO  Take 2 tablets by mouth.        Wound Care: keep clean and dry / remove honeycomb POD  5 Postpartum Instructions: [facility] discharge booklet - instructions reviewed  Discharge to: Home  Follow up :   [facility] in 6 weeks for routine postpartum visit with Dawson Hollman Fredric Mare CNM                Signed: Marlinda Mike CNM, MSN, [facility] 02/02/2013, 12:18 PM

## 2013-02-02 NOTE — Progress Notes (Signed)
POSTOPERATIVE DAY # 2 S/P primary LTCS for breech presentation   S:         Reports feeling well.  Requesting d/c today.             Tolerating po intake / no nausea / no vomiting / passing flatus / no BM             Bleeding is spotting             Pain controlled withmotrin             Up ad lib / ambulatory/ voiding QS  Newborn breast feeding    O:  VS: BP 135/72  Pulse 57  Temp(Src) 97.5 F (36.4 C) (Axillary)  Resp 18  Ht 5\' 6"  (1.676 m)  Wt 83.915 kg (185 lb)  BMI 29.87 kg/m2  SpO2 98%   LABS:               Recent Labs  01/31/13 1125 02/01/13 0555  WBC 17.6* 13.3*  HGB 11.7* 8.2*  PLT 279 223               Bloodtype: --/--/O POS (10/03 0500)  Rubella:                                               I&O: Net -1800 mL             Physical Exam:             Alert and Oriented X3  Lungs: Clear and unlabored  Heart: regular rate and rhythm / no mumurs  Abdomen: soft, non-tender, non-distended, +BS             Fundus: firm, non-tender, U/U             Dressing: Honeycomb and tegaderm dressing dry and intact              Incision:  approximated with sutures / no erythema / no ecchymosis / no drainage  Perineum: intact, not swollen  Lochia: Scant rubra, no clots  Extremities: +1 edema, no calf pain or tenderness, no Homans  A/P:      1.) POD # 2 S/P primary LTCS--Pt doing well  Routine postoperative care   Anticipate d/c this afternoon  2.) Anemia  Continue PNV and iron PP    Cyndee Brightly SNM 02/02/2013, 11:14 AM

## 2013-02-02 NOTE — Progress Notes (Signed)
Will continue Niferex 150 and magnesium supplements at discharge - continue for 6 weeks / repeat cbc and ferritin at 6 week DC  - [facility] booklet / instructions reviewed  Marlinda Mike CNM, MSN, [facility] 02/02/2013 @ 1215pm

## 2014-03-03 ENCOUNTER — Encounter (HOSPITAL_COMMUNITY): Payer: Self-pay | Admitting: Obstetrics

## 2015-09-09 LAB — OB RESULTS CONSOLE HIV ANTIBODY (ROUTINE TESTING): HIV: NONREACTIVE

## 2015-09-09 LAB — OB RESULTS CONSOLE ANTIBODY SCREEN: Antibody Screen: NEGATIVE

## 2015-09-09 LAB — OB RESULTS CONSOLE HEPATITIS B SURFACE ANTIGEN: HEP B S AG: NEGATIVE

## 2015-09-09 LAB — OB RESULTS CONSOLE GC/CHLAMYDIA
Chlamydia: NEGATIVE
Gonorrhea: NEGATIVE

## 2015-09-09 LAB — OB RESULTS CONSOLE RUBELLA ANTIBODY, IGM: Rubella: IMMUNE

## 2015-09-09 LAB — OB RESULTS CONSOLE RPR: RPR: NONREACTIVE

## 2015-09-09 LAB — OB RESULTS CONSOLE ABO/RH: RH TYPE: POSITIVE

## 2016-03-09 ENCOUNTER — Other Ambulatory Visit: Payer: Self-pay | Admitting: Obstetrics

## 2016-03-16 ENCOUNTER — Encounter (HOSPITAL_COMMUNITY): Payer: Self-pay | Admitting: *Deleted

## 2016-03-16 ENCOUNTER — Telehealth (HOSPITAL_COMMUNITY): Payer: Self-pay | Admitting: *Deleted

## 2016-03-16 NOTE — Telephone Encounter (Signed)
Preadmission screen  

## 2016-03-17 ENCOUNTER — Encounter (HOSPITAL_COMMUNITY): Payer: Self-pay

## 2016-03-17 ENCOUNTER — Other Ambulatory Visit: Payer: Self-pay | Admitting: Respiratory Therapy

## 2016-03-18 ENCOUNTER — Other Ambulatory Visit: Payer: Self-pay | Admitting: Certified Nurse Midwife

## 2016-03-18 ENCOUNTER — Ambulatory Visit
Admission: RE | Admit: 2016-03-18 | Discharge: 2016-03-18 | Disposition: A | Discharge status: Home / Self Care | Payer: BLUE CROSS/BLUE SHIELD | Source: Ambulatory Visit | Attending: Certified Nurse Midwife | Admitting: Certified Nurse Midwife

## 2016-03-18 DIAGNOSIS — R053 Chronic cough: Secondary | ICD-10-CM

## 2016-03-18 DIAGNOSIS — R05 Cough: Secondary | ICD-10-CM

## 2016-03-23 ENCOUNTER — Encounter (HOSPITAL_COMMUNITY)
Admission: RE | Admit: 2016-03-23 | Discharge: 2016-03-23 | Disposition: A | Discharge status: Home / Self Care | Payer: BLUE CROSS/BLUE SHIELD | Source: Ambulatory Visit | Attending: Obstetrics and Gynecology | Admitting: Obstetrics and Gynecology

## 2016-03-23 LAB — CBC
HCT: 37.3 % (ref 36.0–46.0)
Hemoglobin: 12.9 g/dL (ref 12.0–15.0)
MCH: 30.5 pg (ref 26.0–34.0)
MCHC: 34.6 g/dL (ref 30.0–36.0)
MCV: 88.2 fL (ref 78.0–100.0)
PLATELETS: 276 10*3/uL (ref 150–400)
RBC: 4.23 MIL/uL (ref 3.87–5.11)
RDW: 14.1 % (ref 11.5–15.5)
WBC: 10.9 10*3/uL — ABNORMAL HIGH (ref 4.0–10.5)

## 2016-03-23 LAB — TYPE AND SCREEN
ABO/RH(D): O POS
Antibody Screen: NEGATIVE

## 2016-03-23 NOTE — Patient Instructions (Addendum)
[place]  03/23/2016   Your procedure is scheduled on:  03/26/2016  Enter through the Maternity Admissions of [facility] at [place] AM.  .   Call this number if you have problems the morning of surgery: [phone]   Remember:   Do not eat food:After Midnight.  Do not drink clear liquids: After Midnight.  Take these medicines the morning of surgery with A SIP OF WATER: none   Do not wear jewelry, make-up or nail polish.  Do not wear lotions, powders, or perfumes. Do not wear deodorant.  Do not shave 48 hours prior to surgery.  Do not bring valuables to the hospital.  [facility] is not   responsible for any belongings or valuables brought to the hospital.  Contacts, dentures or bridgework may not be worn into surgery.  Leave suitcase in the car. After surgery it may be brought to your room.  For patients admitted to the hospital, checkout time is 11:00 AM the day of              discharge.   Patients discharged the day of surgery will not be allowed to drive             home.  Name and phone number of your driver: na  Special Instructions:   N/A   Please read over the following fact sheets that you were given:   Surgical Site Infection Prevention

## 2016-03-24 LAB — RPR: RPR: NONREACTIVE

## 2016-03-25 ENCOUNTER — Other Ambulatory Visit (HOSPITAL_COMMUNITY): Payer: Self-pay | Admitting: Obstetrics

## 2016-03-25 ENCOUNTER — Encounter (HOSPITAL_COMMUNITY): Admission: RE | Admit: 2016-03-25 | Payer: BLUE CROSS/BLUE SHIELD | Source: Ambulatory Visit

## 2016-03-25 ENCOUNTER — Ambulatory Visit (HOSPITAL_COMMUNITY)
Admission: RE | Admit: 2016-03-25 | Discharge: 2016-03-25 | Disposition: A | Discharge status: Home / Self Care | Payer: BLUE CROSS/BLUE SHIELD | Source: Ambulatory Visit | Attending: Obstetrics | Admitting: Obstetrics

## 2016-03-25 DIAGNOSIS — Z01818 Encounter for other preprocedural examination: Secondary | ICD-10-CM

## 2016-03-25 DIAGNOSIS — Z01812 Encounter for preprocedural laboratory examination: Secondary | ICD-10-CM | POA: Insufficient documentation

## 2016-03-26 ENCOUNTER — Inpatient Hospital Stay (HOSPITAL_COMMUNITY): Payer: BLUE CROSS/BLUE SHIELD | Admitting: Certified Registered Nurse Anesthetist

## 2016-03-26 ENCOUNTER — Inpatient Hospital Stay (HOSPITAL_COMMUNITY)
Admission: AD | Admit: 2016-03-26 | Payer: BLUE CROSS/BLUE SHIELD | Source: Ambulatory Visit | Admitting: Obstetrics and Gynecology

## 2016-03-26 ENCOUNTER — Inpatient Hospital Stay (HOSPITAL_COMMUNITY)
Admission: AD | Admit: 2016-03-26 | Discharge: 2016-03-28 | DRG: 766 | Disposition: A | Discharge status: Home / Self Care | Payer: BLUE CROSS/BLUE SHIELD | Source: Ambulatory Visit | Attending: Obstetrics | Admitting: Obstetrics

## 2016-03-26 ENCOUNTER — Encounter (HOSPITAL_COMMUNITY)
Admission: AD | Disposition: A | Discharge status: Home / Self Care | Payer: Self-pay | Source: Ambulatory Visit | Attending: Obstetrics

## 2016-03-26 ENCOUNTER — Encounter (HOSPITAL_COMMUNITY): Payer: Self-pay | Admitting: Anesthesiology

## 2016-03-26 DIAGNOSIS — O99824 Streptococcus B carrier state complicating childbirth: Secondary | ICD-10-CM | POA: Diagnosis present

## 2016-03-26 DIAGNOSIS — Z833 Family history of diabetes mellitus: Secondary | ICD-10-CM

## 2016-03-26 DIAGNOSIS — O43123 Velamentous insertion of umbilical cord, third trimester: Secondary | ICD-10-CM | POA: Diagnosis present

## 2016-03-26 DIAGNOSIS — O34211 Maternal care for low transverse scar from previous cesarean delivery: Secondary | ICD-10-CM | POA: Diagnosis present

## 2016-03-26 DIAGNOSIS — Z98891 History of uterine scar from previous surgery: Secondary | ICD-10-CM

## 2016-03-26 DIAGNOSIS — Z8249 Family history of ischemic heart disease and other diseases of the circulatory system: Secondary | ICD-10-CM | POA: Diagnosis not present

## 2016-03-26 DIAGNOSIS — Z3A39 39 weeks gestation of pregnancy: Secondary | ICD-10-CM

## 2016-03-26 SURGERY — Surgical Case
Anesthesia: Spinal

## 2016-03-26 MED ORDER — DEXAMETHASONE SODIUM PHOSPHATE 10 MG/ML IJ SOLN
INTRAMUSCULAR | Status: DC | PRN
  Administered 2016-03-26: 10 mg via INTRAVENOUS

## 2016-03-26 MED ORDER — OXYTOCIN 10 UNIT/ML IJ SOLN
INTRAVENOUS | Status: DC | PRN
  Administered 2016-03-26: 40 [IU] via INTRAVENOUS

## 2016-03-26 MED ORDER — DEXAMETHASONE SODIUM PHOSPHATE 10 MG/ML IJ SOLN
INTRAMUSCULAR | Status: AC
  Filled 2016-03-26: qty 1

## 2016-03-26 MED ORDER — COCONUT OIL OIL: 1.0000 "application " | TOPICAL_OIL | Status: DC | PRN

## 2016-03-26 MED ORDER — CEFAZOLIN SODIUM-DEXTROSE 2-4 GM/100ML-% IV SOLN
2.0000 g | INTRAVENOUS | Status: AC
  Administered 2016-03-26: 2 g via INTRAVENOUS
  Filled 2016-03-26: qty 100

## 2016-03-26 MED ORDER — OXYTOCIN 40 UNITS IN LACTATED RINGERS INFUSION - SIMPLE MED: 2.5000 [IU]/h | INTRAVENOUS | Status: DC

## 2016-03-26 MED ORDER — LACTATED RINGERS IV SOLN
INTRAVENOUS | Status: DC | PRN
  Administered 2016-03-26: 10:00:00 via INTRAVENOUS

## 2016-03-26 MED ORDER — DIBUCAINE 1 % RE OINT: 1.0000 "application " | TOPICAL_OINTMENT | RECTAL | Status: DC | PRN

## 2016-03-26 MED ORDER — SODIUM CHLORIDE 0.9% FLUSH: 3.0000 mL | INTRAVENOUS | Status: DC | PRN

## 2016-03-26 MED ORDER — SIMETHICONE 80 MG PO CHEW: 80.0000 mg | CHEWABLE_TABLET | ORAL | Status: DC | PRN

## 2016-03-26 MED ORDER — SIMETHICONE 80 MG PO CHEW
80.0000 mg | CHEWABLE_TABLET | ORAL | Status: DC
  Administered 2016-03-27 (×2): 80 mg via ORAL
  Filled 2016-03-26 (×3): qty 1

## 2016-03-26 MED ORDER — MORPHINE SULFATE (PF) 0.5 MG/ML IJ SOLN
INTRAMUSCULAR | Status: DC | PRN
  Administered 2016-03-26: .2 mg via INTRATHECAL

## 2016-03-26 MED ORDER — NALBUPHINE HCL 10 MG/ML IJ SOLN: 5.0000 mg | INTRAMUSCULAR | Status: DC | PRN

## 2016-03-26 MED ORDER — BUPIVACAINE IN DEXTROSE 0.75-8.25 % IT SOLN
INTRATHECAL | Status: DC | PRN
  Administered 2016-03-26: 1.4 mL via INTRATHECAL

## 2016-03-26 MED ORDER — OXYTOCIN 10 UNIT/ML IJ SOLN
INTRAMUSCULAR | Status: AC
  Filled 2016-03-26: qty 4

## 2016-03-26 MED ORDER — SIMETHICONE 80 MG PO CHEW
80.0000 mg | CHEWABLE_TABLET | Freq: Three times a day (TID) | ORAL | Status: DC
  Administered 2016-03-26 – 2016-03-28 (×5): 80 mg via ORAL
  Filled 2016-03-26 (×5): qty 1

## 2016-03-26 MED ORDER — PHENYLEPHRINE 8 MG IN D5W 100 ML (0.08MG/ML) PREMIX OPTIME
INJECTION | INTRAVENOUS | Status: DC | PRN
  Administered 2016-03-26: 60 ug/min via INTRAVENOUS

## 2016-03-26 MED ORDER — NALBUPHINE HCL 10 MG/ML IJ SOLN: 5.0000 mg | Freq: Once | INTRAMUSCULAR | Status: DC | PRN

## 2016-03-26 MED ORDER — KETOROLAC TROMETHAMINE 30 MG/ML IJ SOLN: 30.0000 mg | Freq: Four times a day (QID) | INTRAMUSCULAR | Status: DC | PRN

## 2016-03-26 MED ORDER — ACETAMINOPHEN 500 MG PO TABS: 1000.0000 mg | ORAL_TABLET | Freq: Four times a day (QID) | ORAL | Status: DC

## 2016-03-26 MED ORDER — ACETAMINOPHEN 325 MG PO TABS: 650.0000 mg | ORAL_TABLET | ORAL | Status: DC | PRN

## 2016-03-26 MED ORDER — WITCH HAZEL-GLYCERIN EX PADS
1.0000 | MEDICATED_PAD | CUTANEOUS | Status: DC | PRN
Start: 2016-03-26 — End: 2016-03-28

## 2016-03-26 MED ORDER — PRENATAL MULTIVITAMIN CH
1.0000 | ORAL_TABLET | Freq: Every day | ORAL | Status: DC
  Filled 2016-03-26: qty 1

## 2016-03-26 MED ORDER — FENTANYL CITRATE (PF) 100 MCG/2ML IJ SOLN
INTRAMUSCULAR | Status: DC | PRN
  Administered 2016-03-26: 20 ug via INTRATHECAL

## 2016-03-26 MED ORDER — ONDANSETRON HCL 4 MG/2ML IJ SOLN
INTRAMUSCULAR | Status: AC
  Filled 2016-03-26: qty 2

## 2016-03-26 MED ORDER — ONDANSETRON HCL 4 MG/2ML IJ SOLN
INTRAMUSCULAR | Status: DC | PRN
Start: 2016-03-26 — End: 2016-03-26
  Administered 2016-03-26: 4 mg via INTRAVENOUS

## 2016-03-26 MED ORDER — IBUPROFEN 600 MG PO TABS: 600.0000 mg | ORAL_TABLET | Freq: Four times a day (QID) | ORAL | Status: DC | PRN

## 2016-03-26 MED ORDER — LACTATED RINGERS IV SOLN
INTRAVENOUS | Status: DC | PRN
  Administered 2016-03-26 (×2): via INTRAVENOUS

## 2016-03-26 MED ORDER — FENTANYL CITRATE (PF) 100 MCG/2ML IJ SOLN
INTRAMUSCULAR | Status: AC
  Filled 2016-03-26: qty 2

## 2016-03-26 MED ORDER — SCOPOLAMINE 1 MG/3DAYS TD PT72
MEDICATED_PATCH | TRANSDERMAL | Status: AC
  Filled 2016-03-26: qty 2

## 2016-03-26 MED ORDER — NALOXONE HCL 2 MG/2ML IJ SOSY
1.0000 ug/kg/h | PREFILLED_SYRINGE | INTRAVENOUS | Status: DC | PRN
  Filled 2016-03-26: qty 2

## 2016-03-26 MED ORDER — SCOPOLAMINE 1 MG/3DAYS TD PT72: 1.0000 | MEDICATED_PATCH | Freq: Once | TRANSDERMAL | Status: DC

## 2016-03-26 MED ORDER — OXYCODONE HCL 5 MG PO TABS: 10.0000 mg | ORAL_TABLET | ORAL | Status: DC | PRN

## 2016-03-26 MED ORDER — DIPHENHYDRAMINE HCL 25 MG PO CAPS: 25.0000 mg | ORAL_CAPSULE | Freq: Four times a day (QID) | ORAL | Status: DC | PRN

## 2016-03-26 MED ORDER — DIPHENHYDRAMINE HCL 25 MG PO CAPS
25.0000 mg | ORAL_CAPSULE | ORAL | Status: DC | PRN
  Filled 2016-03-26: qty 1

## 2016-03-26 MED ORDER — SODIUM CHLORIDE 0.9 % IR SOLN
Status: DC | PRN
  Administered 2016-03-26: 1000 mL

## 2016-03-26 MED ORDER — MORPHINE SULFATE-NACL 0.5-0.9 MG/ML-% IV SOSY
PREFILLED_SYRINGE | INTRAVENOUS | Status: AC
  Filled 2016-03-26: qty 1

## 2016-03-26 MED ORDER — SCOPOLAMINE 1 MG/3DAYS TD PT72
MEDICATED_PATCH | TRANSDERMAL | Status: DC | PRN
  Administered 2016-03-26: 1 via TRANSDERMAL

## 2016-03-26 MED ORDER — SCOPOLAMINE 1 MG/3DAYS TD PT72
MEDICATED_PATCH | TRANSDERMAL | Status: AC
  Filled 2016-03-26: qty 1

## 2016-03-26 MED ORDER — IBUPROFEN 600 MG PO TABS
600.0000 mg | ORAL_TABLET | Freq: Four times a day (QID) | ORAL | Status: DC
  Administered 2016-03-26 – 2016-03-28 (×7): 600 mg via ORAL
  Filled 2016-03-26 (×7): qty 1

## 2016-03-26 MED ORDER — OXYCODONE HCL 5 MG PO TABS: 5.0000 mg | ORAL_TABLET | ORAL | Status: DC | PRN

## 2016-03-26 MED ORDER — LACTATED RINGERS IV SOLN
INTRAVENOUS | Status: DC
  Administered 2016-03-26: 09:00:00 via INTRAVENOUS

## 2016-03-26 MED ORDER — SENNOSIDES-DOCUSATE SODIUM 8.6-50 MG PO TABS
2.0000 | ORAL_TABLET | ORAL | Status: DC
  Administered 2016-03-27 (×2): 2 via ORAL
  Filled 2016-03-26 (×2): qty 2

## 2016-03-26 MED ORDER — LACTATED RINGERS IV SOLN
INTRAVENOUS | Status: DC
  Administered 2016-03-26 (×2): via INTRAVENOUS

## 2016-03-26 MED ORDER — KETOROLAC TROMETHAMINE 30 MG/ML IJ SOLN
INTRAMUSCULAR | Status: AC
  Administered 2016-03-26: 30 mg via INTRAVENOUS
  Filled 2016-03-26: qty 1

## 2016-03-26 MED ORDER — TETANUS-DIPHTH-ACELL PERTUSSIS 5-2.5-18.5 LF-MCG/0.5 IM SUSP: 0.5000 mL | Freq: Once | INTRAMUSCULAR | Status: DC

## 2016-03-26 MED ORDER — ONDANSETRON HCL 4 MG/2ML IJ SOLN: 4.0000 mg | Freq: Three times a day (TID) | INTRAMUSCULAR | Status: DC | PRN

## 2016-03-26 MED ORDER — NALOXONE HCL 0.4 MG/ML IJ SOLN: 0.4000 mg | INTRAMUSCULAR | Status: DC | PRN

## 2016-03-26 MED ORDER — PHENYLEPHRINE 8 MG IN D5W 100 ML (0.08MG/ML) PREMIX OPTIME
INJECTION | INTRAVENOUS | Status: AC
  Filled 2016-03-26: qty 100

## 2016-03-26 MED ORDER — MENTHOL 3 MG MT LOZG: 1.0000 | LOZENGE | OROMUCOSAL | Status: DC | PRN

## 2016-03-26 MED ORDER — DIPHENHYDRAMINE HCL 50 MG/ML IJ SOLN: 12.5000 mg | INTRAMUSCULAR | Status: DC | PRN

## 2016-03-26 SURGICAL SUPPLY — 38 items
BENZOIN TINCTURE PRP APPL 2/3 (GAUZE/BANDAGES/DRESSINGS) ×3 IMPLANT
CHLORAPREP W/TINT 26ML (MISCELLANEOUS) ×3 IMPLANT
CLAMP CORD UMBIL (MISCELLANEOUS) IMPLANT
CLOSURE STERI STRIP 1/2 X4 (GAUZE/BANDAGES/DRESSINGS) ×3 IMPLANT
CLOSURE WOUND 1/2 X4 (GAUZE/BANDAGES/DRESSINGS)
CLOTH BEACON ORANGE TIMEOUT ST (SAFETY) ×3 IMPLANT
CONTAINER PREFILL 10% NBF 15ML (MISCELLANEOUS) IMPLANT
DRSG OPSITE POSTOP 4X10 (GAUZE/BANDAGES/DRESSINGS) ×3 IMPLANT
ELECT REM PT RETURN 9FT ADLT (ELECTROSURGICAL) ×3
ELECTRODE REM PT RTRN 9FT ADLT (ELECTROSURGICAL) ×1 IMPLANT
EXTRACTOR VACUUM M CUP 4 TUBE (SUCTIONS) IMPLANT
EXTRACTOR VACUUM M CUP 4' TUBE (SUCTIONS)
GLOVE BIO SURGEON STRL SZ 6.5 (GLOVE) ×2 IMPLANT
GLOVE BIO SURGEONS STRL SZ 6.5 (GLOVE) ×1
GLOVE BIOGEL PI IND STRL 7.0 (GLOVE) ×2 IMPLANT
GLOVE BIOGEL PI INDICATOR 7.0 (GLOVE) ×4
GOWN STRL REUS W/TWL LRG LVL3 (GOWN DISPOSABLE) ×6 IMPLANT
KIT ABG SYR 3ML LUER SLIP (SYRINGE) IMPLANT
NEEDLE HYPO 22GX1.5 SAFETY (NEEDLE) IMPLANT
NEEDLE HYPO 25X5/8 SAFETYGLIDE (NEEDLE) IMPLANT
NS IRRIG 1000ML POUR BTL (IV SOLUTION) ×3 IMPLANT
PACK C SECTION WH (CUSTOM PROCEDURE TRAY) ×3 IMPLANT
PAD OB MATERNITY 4.3X12.25 (PERSONAL CARE ITEMS) ×3 IMPLANT
PENCIL SMOKE EVAC W/HOLSTER (ELECTROSURGICAL) ×3 IMPLANT
STRIP CLOSURE SKIN 1/2X4 (GAUZE/BANDAGES/DRESSINGS) IMPLANT
SUT MON AB 4-0 PS1 27 (SUTURE) ×3 IMPLANT
SUT PLAIN 0 NONE (SUTURE) IMPLANT
SUT PLAIN 2 0 (SUTURE) ×2
SUT PLAIN 2 0 XLH (SUTURE) IMPLANT
SUT PLAIN ABS 2-0 CT1 27XMFL (SUTURE) ×1 IMPLANT
SUT VIC AB 0 CT1 36 (SUTURE) ×6 IMPLANT
SUT VIC AB 0 CTX 36 (SUTURE) ×6
SUT VIC AB 0 CTX36XBRD ANBCTRL (SUTURE) ×3 IMPLANT
SUT VIC AB 2-0 CT1 27 (SUTURE) ×2
SUT VIC AB 2-0 CT1 TAPERPNT 27 (SUTURE) ×1 IMPLANT
SYR CONTROL 10ML LL (SYRINGE) IMPLANT
TOWEL OR 17X24 6PK STRL BLUE (TOWEL DISPOSABLE) ×3 IMPLANT
TRAY FOLEY CATH SILVER 14FR (SET/KITS/TRAYS/PACK) IMPLANT

## 2016-03-26 NOTE — Anesthesia Procedure Notes (Signed)
Spinal  Patient location during procedure: OR Start time: 03/26/2016 9:20 AM End time: 03/26/2016 9:23 AM Staffing Anesthesiologist: Lyn Hollingshead Performed: anesthesiologist  Preanesthetic Checklist Completed: patient identified, surgical consent, pre-op evaluation, timeout performed, IV checked, risks and benefits discussed and monitors and equipment checked Spinal Block Patient position: sitting Prep: site prepped and draped and DuraPrep Patient monitoring: heart rate, cardiac monitor, continuous pulse ox and blood pressure Approach: midline Location: L3-4 Injection technique: single-shot Needle Needle type: Sprotte  Needle gauge: 24 G Needle length: 9 cm Needle insertion depth: 5 cm Assessment Sensory level: T4

## 2016-03-26 NOTE — Lactation Note (Signed)
This note was copied from a baby's chart. Lactation Consultation Note initial visit at 43 hours of age.  Mom reports good feedings and has experience with older child nursing for 12 months.  Mom denies pain with latching today.  Mom does report baby spit up small amount of colostrum earlier.  Baby laying in crib noted to be gagging.  LC patted baby and he burped.  [place] handed baby to mom to hold up right.  [facility] LC resources given and discussed.  Encouraged to feed with early cues on demand 8-12x/24 hours.  Early newborn behavior discussed.  Hand expression demonstrated by mom with colostrum visible.  Mom to call for assist as needed.    Patient Name: Erika Jennings [id number] Date: 03/26/2016 Reason for consult: Initial assessment   Maternal Data Has patient been taught Hand Expression?: Yes Does the patient have breastfeeding experience prior to this delivery?: Yes  Feeding Feeding Type: Breast Fed Length of feed: 25 min  LATCH Score/Interventions Latch: Grasps breast easily, tongue down, lips flanged, rhythmical sucking.  Audible Swallowing: Spontaneous and intermittent  Type of Nipple: Everted at rest and after stimulation  Comfort (Breast/Nipple): Soft / non-tender     Hold (Positioning): No assistance needed to correctly position infant at breast. Intervention(s): Breastfeeding basics reviewed  LATCH Score: 10  Lactation Tools Discussed/Used     Consult Status Consult Status: Follow-up Date: 03/27/16 Follow-up type: In-patient    Justice Britain 03/26/2016, 10:02 PM

## 2016-03-26 NOTE — Transfer of Care (Signed)
Immediate Anesthesia Transfer of Care Note  Patient: Erika Jennings  Procedure(s) Performed: Procedure(s) with comments: Repeat CESAREAN SECTION (N/A) - EDD: 03/31/16  Patient Location: PACU  Anesthesia Type:Spinal  Level of Consciousness: awake, alert  and oriented  Airway & Oxygen Therapy: Patient Spontanous Breathing  Post-op Assessment: Report given to RN and Post -op Vital signs reviewed and stable  Post vital signs: Reviewed and stable  Last Vitals:  Vitals:   03/26/16 0816  BP: 135/80  Pulse: 65  Resp: 18  Temp: 36.5 C    Last Pain:  Vitals:   03/26/16 0816  TempSrc: Oral         Complications: No apparent anesthesia complications

## 2016-03-26 NOTE — H&P (Signed)
Erika Jennings is a 35 y.o. G2P1001 at [redacted]w[redacted]d presenting for RCS. Pt notes no contractions . Good fetal movement, No vaginal bleeding, not leaking fluid.  PNCare at [facility] since 8 wks - PNA last wk, s/p Zpack, clear by CXR 2 days ago - marginal CI - GBS pos    Prenatal Transfer Tool  Maternal Diabetes: No Genetic Screening: Normal Maternal Ultrasounds/Referrals: Normal Fetal Ultrasounds or other Referrals:  None Maternal Substance Abuse:  No Significant Maternal Medications:  None Significant Maternal Lab Results: None             OB History    Gravida Para Term Preterm AB Living   2 1 1  0 0 1   SAB TAB Ectopic Multiple Live Births   0 0 0 0 1         Past Medical History:  Diagnosis Date  . Anemia during pregnancy 2014   First pregnancy  . Pica         Past Surgical History:  Procedure Laterality Date  . CESAREAN SECTION N/A 01/31/2013   Procedure: CESAREAN SECTION;  Surgeon: Erika Billings A. Pamala Hurry, MD;  Location: [facility];  Service: Obstetrics;  Laterality: N/A;  . WISDOM TOOTH EXTRACTION     Family History: family history includes Cancer in her maternal grandmother and paternal grandmother; Diabetes in her paternal grandmother; Heart attack in her paternal grandfather; Heart disease in her paternal grandfather; Rheum arthritis in her paternal grandmother. Social History:  reports that she has never smoked. She has never used smokeless tobacco. She reports that she does not drink alcohol or use drugs.  Review of Systems - Negative except no concerns     Blood pressure 135/80, pulse 65, temperature 97.7 F (36.5 C), temperature source Oral, resp. rate 18, height 5\' 5"  (1.651 m), weight 80.3 kg (177 lb), last menstrual period 06/25/2015, unknown if currently breastfeeding.  Physical Exam:  Gen: well appearing, no distress Back: no CVAT Abd: gravid, NT, no RUQ pain LE: trace edema, equal bilaterally, non-tender Toco:  rare   Prenatal labs: ABO, Rh: --/--/O POS (11/22 1223) Antibody: NEG (11/22 1223) Rubella: !Error!immune RPR: Non Reactive (11/22 1226)  HBsAg: Negative (05/10 0000)  HIV: Non-reactive (05/10 0000)  GBS:   neg 1 hr Glucola 120          Genetic screening nl Informaseq Anatomy US normal  CBC Labs (Brief)          Component Value Date/Time   WBC 10.9 (H) 03/23/2016 1226   RBC 4.23 03/23/2016 1226   HGB 12.9 03/23/2016 1226   HCT 37.3 03/23/2016 1226   PLT 276 03/23/2016 1226   MCV 88.2 03/23/2016 1226   MCH 30.5 03/23/2016 1226   MCHC 34.6 03/23/2016 1226   RDW 14.1 03/23/2016 1226        Assessment/Plan: 35 y.o. G2P1001 at [redacted]w[redacted]d Elective RCS, R/B d/w pt. [place] for blood if needed, no TL.       [name] A. 03/26/2016, 8:48 AM

## 2016-03-26 NOTE — Anesthesia Postprocedure Evaluation (Signed)
Anesthesia Post Note  Patient: Erika Jennings  Procedure(s) Performed: Procedure(s) (LRB): Repeat CESAREAN SECTION (N/A)  Patient location during evaluation: PACU Anesthesia Type: Spinal Level of consciousness: awake Pain management: pain level controlled Vital Signs Assessment: post-procedure vital signs reviewed and stable Respiratory status: spontaneous breathing Cardiovascular status: stable Postop Assessment: no headache, no backache, spinal receding, patient able to bend at knees and no signs of nausea or vomiting Anesthetic complications: no     Last Vitals:  Vitals:   03/26/16 0816  BP: 135/80  Pulse: 65  Resp: 18  Temp: 36.5 C    Last Pain:  Vitals:   03/26/16 0816  TempSrc: Oral   Pain Goal:                 Erika Jennings,Erika Jennings

## 2016-03-26 NOTE — Progress Notes (Signed)
Erika Jennings is a 35 y.o. G2P1001 at [redacted]w[redacted]d presenting for RCS. Pt notes no contractions . Good fetal movement, No vaginal bleeding, not leaking fluid.  PNCare at [facility] since 8 wks - PNA last wk, s/p Zpack, clear by CXR 2 days ago - marginal CI - GBS pos    Prenatal Transfer Tool  Maternal Diabetes: No Genetic Screening: Normal Maternal Ultrasounds/Referrals: Normal Fetal Ultrasounds or other Referrals:  None Maternal Substance Abuse:  No Significant Maternal Medications:  None Significant Maternal Lab Results: None     OB History    Gravida Para Term Preterm AB Living   2 1 1  0 0 1   SAB TAB Ectopic Multiple Live Births   0 0 0 0 1     Past Medical History:  Diagnosis Date  . Anemia during pregnancy 2014   First pregnancy  . Pica    Past Surgical History:  Procedure Laterality Date  . CESAREAN SECTION N/A 01/31/2013   Procedure: CESAREAN SECTION;  Surgeon: Claiborne Billings A. Pamala Hurry, MD;  Location: [facility];  Service: Obstetrics;  Laterality: N/A;  . WISDOM TOOTH EXTRACTION     Family History: family history includes Cancer in her maternal grandmother and paternal grandmother; Diabetes in her paternal grandmother; Heart attack in her paternal grandfather; Heart disease in her paternal grandfather; Rheum arthritis in her paternal grandmother. Social History:  reports that she has never smoked. She has never used smokeless tobacco. She reports that she does not drink alcohol or use drugs.  Review of Systems - Negative except no concerns     Blood pressure 135/80, pulse 65, temperature 97.7 F (36.5 C), temperature source Oral, resp. rate 18, height 5\' 5"  (1.651 m), weight 80.3 kg (177 lb), last menstrual period 06/25/2015, unknown if currently breastfeeding.  Physical Exam:  Gen: well appearing, no distress Back: no CVAT Abd: gravid, NT, no RUQ pain LE: trace edema, equal bilaterally, non-tender Toco: rare   Prenatal labs: ABO, Rh: --/--/O POS (11/22  1223) Antibody: NEG (11/22 1223) Rubella: !Error!immune RPR: Non Reactive (11/22 1226)  HBsAg: Negative (05/10 0000)  HIV: Non-reactive (05/10 0000)  GBS:   neg 1 hr Glucola 120  Genetic screening nl Informaseq Anatomy US normal  CBC    Component Value Date/Time   WBC 10.9 (H) 03/23/2016 1226   RBC 4.23 03/23/2016 1226   HGB 12.9 03/23/2016 1226   HCT 37.3 03/23/2016 1226   PLT 276 03/23/2016 1226   MCV 88.2 03/23/2016 1226   MCH 30.5 03/23/2016 1226   MCHC 34.6 03/23/2016 1226   RDW 14.1 03/23/2016 1226      Assessment/Plan: 35 y.o. G2P1001 at [redacted]w[redacted]d Elective RCS, R/B d/w pt. [place] for blood if needed, no TL.    Darbie Biancardi A. 03/26/2016, 8:48 AM

## 2016-03-26 NOTE — Anesthesia Preprocedure Evaluation (Signed)
Anesthesia Evaluation  Patient identified by MRN, date of birth, ID band Patient awake    Reviewed: Allergy & Precautions, H&P , NPO status , Patient's Chart, lab work & pertinent test results  Airway Mallampati: I  TM Distance: >3 FB Neck ROM: full    Dental no notable dental hx.    Pulmonary neg pulmonary ROS,    Pulmonary exam normal        Cardiovascular negative cardio ROS Normal cardiovascular exam     Neuro/Psych negative neurological ROS     GI/Hepatic negative GI ROS, Neg liver ROS,   Endo/Other  negative endocrine ROS  Renal/GU negative Renal ROS     Musculoskeletal   Abdominal Normal abdominal exam  (+)   Peds  Hematology   Anesthesia Other Findings   Reproductive/Obstetrics (+) Pregnancy                             Anesthesia Physical Anesthesia Plan  ASA: II  Anesthesia Plan: Spinal   Post-op Pain Management:    Induction:   Airway Management Planned:   Additional Equipment:   Intra-op Plan:   Post-operative Plan:   Informed Consent: I have reviewed the patients History and Physical, chart, labs and discussed the procedure including the risks, benefits and alternatives for the proposed anesthesia with the patient or authorized representative who has indicated his/her understanding and acceptance.     Plan Discussed with:   Anesthesia Plan Comments:         Anesthesia Quick Evaluation

## 2016-03-26 NOTE — Brief Op Note (Signed)
03/26/2016  11:21 AM  PATIENT:  Erika Jennings  35 y.o. female  PRE-OPERATIVE DIAGNOSIS:  Previous Cesarean Section  POST-OPERATIVE DIAGNOSIS:  elective repeat cesarean section  PROCEDURE:  Procedure(s) with comments: Repeat CESAREAN SECTION (N/A) - EDD: 03/31/16  2 layer closure  SURGEON:  Surgeon(s) and Role:    * Aloha Gell, MD - Primary  PHYSICIAN ASSISTANT:   ASSISTANTS: Artelia Laroche, CNM   ANESTHESIA:   spinal  EBL:  Total I/O In: 2200 [I.V.:2200] Out: 850 [Urine:200; Blood:650]  BLOOD ADMINISTERED:none  DRAINS: Urinary Catheter (Foley)   LOCAL MEDICATIONS USED:  NONE  SPECIMEN:  Source of Specimen:  Placenta  DISPOSITION OF SPECIMEN:  Labor and delivery  COUNTS:  YES  TOURNIQUET:  * No tourniquets in log *  DICTATION: .Note written in EPIC  PLAN OF CARE: Admit to inpatient   PATIENT DISPOSITION:  PACU - hemodynamically stable.   Delay start of Pharmacological VTE agent (>24hrs) due to surgical blood loss or risk of bleeding: yes

## 2016-03-26 NOTE — Op Note (Signed)
03/26/2016  11:21 AM  PATIENT:  Erika Jennings  35 y.o. female  PRE-OPERATIVE DIAGNOSIS:  Previous Cesarean Section  POST-OPERATIVE DIAGNOSIS:  elective repeat cesarean section  PROCEDURE:  Procedure(s) with comments: Repeat CESAREAN SECTION (N/A) - EDD: 03/31/16  2 layer closure  SURGEON:  Surgeon(s) and Role:    * Aloha Gell, MD - Primary  PHYSICIAN ASSISTANT:   ASSISTANTS: Artelia Laroche, CNM   ANESTHESIA:   spinal  EBL:  Total I/O In: 2200 [I.V.:2200] Out: 850 [Urine:200; Blood:650]  BLOOD ADMINISTERED:none  DRAINS: Urinary Catheter (Foley)   LOCAL MEDICATIONS USED:  NONE  SPECIMEN:  Source of Specimen:  Placenta  DISPOSITION OF SPECIMEN:  Labor and delivery  COUNTS:  YES  TOURNIQUET:  * No tourniquets in log *  DICTATION: .Note written in EPIC  PLAN OF CARE: Admit to inpatient   PATIENT DISPOSITION:  PACU - hemodynamically stable.   Delay start of Pharmacological VTE agent (>24hrs) due to surgical blood loss or risk of bleeding: yes     Findings:  @BABYSEXEBC @ infant,  APGAR (1 MIN): 8   APGAR (5 MINS): 9   APGAR (10 MINS):   Normal uterus, tubes and ovaries, normal placenta. 3VC, clear amniotic fluid  EBL: 650 cc Antibiotics:   2g Ancef Complications: none  Indications: This is a 35 y.o. year-old, G2 P1  At [redacted]w[redacted]d admitted for repeat cesarean section. Risks benefits and alternatives of the procedure were discussed with the patient who agreed to proceed  Procedure:  After informed consent was obtained the patient was taken to the operating room where spinal anesthesia was initiated.  She was prepped and draped in the normal sterile fashion in dorsal supine position with a leftward tilt.  A foley catheter was in place.  A Pfannenstiel skin incision was made 2 cm above the pubic symphysis in the midline with the scalpel.  Dissection was carried down with the Bovie cautery until the fascia was reached. The fascia was incised in the midline. The  incision was extended laterally with the Mayo scissors. The inferior aspect of the fascial incision was grasped with the Coker clamps, elevated up and the underlying rectus muscles were dissected off sharply. The superior aspect of the fascial incision was grasped with the Coker clamps elevated up and the underlying rectus muscles were dissected off sharply.  The peritoneum was entered bluntly. The peritoneal incision was extended superiorly and inferiorly with good visualization of the bladder. The bladder blade was inserted and palpation was done to assess the fetal position and the location of the uterine vessels. A bladder flap was created sharply The lower segment of the uterus was incised sharply with the scalpel and extended  bluntly in the cephalo-caudal fashion. The infant was grasped, brought to the incision,  rotated and the infant was delivered with fundal pressure. The nose and mouth were bulb suctioned. The cord was clamped and cut after 1 minute delay. The infant was handed off to the waiting pediatrician. The placenta was expressed . The uterus was left in situ The uterus was cleared of all clots and debris. The uterine incision was repaired with 0 Vicryl in a running locked fashion.  A second layer of the same suture was used in an imbricating fashion to obtain excellent hemostasis. An additional suture at the right angle was used for hemostasis. The gutters were cleared of all clots and debris. The uterine incision was reinspected and found to be hemostatic. The peritoneum was grasped and closed with 2-0 Vicryl  in a running fashion. The cut muscle edges and the underside of the fascia were inspected and found to be hemostatic. The fascia was closed with 0 Vicryl in two halves . The subcutaneous tissue was irrigated. Scarpa's layer was closed with a 2-0 plain gut suture. The skin was closed with a 4-0 Monocryl in a single layer. The patient tolerated the procedure well. Sponge lap and needle counts  were correct x3 and patient was taken to the recovery room in a stable condition.  Renzo Vincelette A. 03/26/2016 11:22 AM

## 2016-03-27 LAB — CBC
HCT: 29 % — ABNORMAL LOW (ref 36.0–46.0)
Hemoglobin: 10.4 g/dL — ABNORMAL LOW (ref 12.0–15.0)
MCH: 31.2 pg (ref 26.0–34.0)
MCHC: 35.9 g/dL (ref 30.0–36.0)
MCV: 87.1 fL (ref 78.0–100.0)
PLATELETS: 260 10*3/uL (ref 150–400)
RBC: 3.33 MIL/uL — AB (ref 3.87–5.11)
RDW: 13.9 % (ref 11.5–15.5)
WBC: 13.3 10*3/uL — AB (ref 4.0–10.5)

## 2016-03-27 MED ORDER — KETOROLAC TROMETHAMINE 30 MG/ML IJ SOLN
30.0000 mg | Freq: Once | INTRAMUSCULAR | Status: AC
  Administered 2016-03-26: 30 mg via INTRAVENOUS

## 2016-03-27 MED ORDER — MEPERIDINE HCL 25 MG/ML IJ SOLN: 6.2500 mg | INTRAMUSCULAR | Status: DC | PRN

## 2016-03-27 MED ORDER — PROMETHAZINE HCL 25 MG/ML IJ SOLN: 6.2500 mg | INTRAMUSCULAR | Status: DC | PRN

## 2016-03-27 MED ORDER — HYDROMORPHONE HCL 1 MG/ML IJ SOLN: 0.2500 mg | INTRAMUSCULAR | Status: DC | PRN

## 2016-03-27 NOTE — Progress Notes (Signed)
POSTOPERATIVE DAY # 1 S/P CS - repeat   S:         Reports feeling well             Tolerating po intake / no nausea / no vomiting / + flatus / no BM             Bleeding is light             Pain controlled with motrin             Up ad lib / ambulatory/ voiding QS  Newborn breast feeding   O:  VS: BP (!) 90/50   Pulse (!) 55   Temp 98.2 F (36.8 C)   Resp 18   Ht 5\' 5"  (1.651 m)   Wt 80.3 kg (177 lb)   LMP 06/25/2015   SpO2 96%   Breastfeeding? Unknown   BMI 29.45 kg/m    LABS:               Recent Labs  03/27/16 0512  WBC 13.3*  HGB 10.4*  PLT 260               Bloodtype: --/--/O POS (11/22 1223)  Rubella: Immune (05/10 0000)               tdap and flu current 2017                                           I&O: Intake/Output      11/25 0701 - 11/26 0700 11/26 0701 - 11/27 0700   P.O. 2880    I.V. (mL/kg) 2700 (33.6)    Other 175    Total Intake(mL/kg) 5755 (71.7)    Urine (mL/kg/hr) 3400    Blood 650    Total Output 4050     Net +1705                       Physical Exam:             Alert and Oriented X3  Lungs: Clear and unlabored  Heart: regular rate and rhythm / no mumurs  Abdomen: soft, non-tender, non-distended              Fundus: firm, non-tender, Ueven             Dressing intact              Incision:  approximated with suture / no erythema / no ecchymosis / no drainage  Perineum: intact  Lochia: light  Extremities: no edema, no calf pain or tenderness, SCDs in place  A:        POD # 1 S/P CS             P:        Routine postoperative care              Anticipate Dc tomorrow     Erika Jennings CNM, MSN, [facility] 03/27/2016, 8:22 AM

## 2016-03-28 MED ORDER — SENNOSIDES-DOCUSATE SODIUM 8.6-50 MG PO TABS: 2.0000 | ORAL_TABLET | ORAL | Status: AC

## 2016-03-28 MED ORDER — SIMETHICONE 80 MG PO CHEW: 80.0000 mg | CHEWABLE_TABLET | Freq: Three times a day (TID) | ORAL | 0 refills | Status: AC

## 2016-03-28 MED ORDER — COCONUT OIL OIL: 1.0000 "application " | TOPICAL_OIL | 0 refills | Status: AC | PRN

## 2016-03-28 MED ORDER — IBUPROFEN 600 MG PO TABS: 600.0000 mg | ORAL_TABLET | Freq: Four times a day (QID) | ORAL | 0 refills | Status: AC

## 2016-03-28 MED ORDER — OXYCODONE HCL 10 MG PO TABS: 10.0000 mg | ORAL_TABLET | ORAL | 0 refills | Status: AC | PRN

## 2016-03-28 MED ORDER — ACETAMINOPHEN 325 MG PO TABS: 650.0000 mg | ORAL_TABLET | ORAL | Status: AC | PRN

## 2016-03-28 NOTE — Discharge Summary (Signed)
OB Discharge Summary     Patient Name: Erika Jennings DOB: 08-23-1980 MRN: [id number]  Date of admission: 03/26/2016 Delivering MD: Aloha Gell   Date of discharge: 03/28/2016  Admitting diagnosis: c section Previous Cesarean Section Intrauterine pregnancy: [redacted]w[redacted]d     Secondary diagnosis:  Principal Problem:   Postpartum care following cesarean delivery (11/25) Active Problems:   S/P repeat low transverse C-section    Discharge diagnosis: Term Pregnancy Delivered                                   Complications: None  Hospital course:  Sceduled C/S   35 y.o. yo G2P2002 at [redacted]w[redacted]d was admitted to the hospital 03/26/2016 for scheduled cesarean section with the following indication:Elective Repeat.  Membrane Rupture Time/Date: 9:48 AM ,03/26/2016   Patient delivered a Viable infant.03/26/2016  Details of operation can be found in separate operative note.  Pateint had an uncomplicated postpartum course.  She is ambulating, tolerating a regular diet, passing flatus, and urinating well. Patient is discharged home in stable condition on  03/28/16          Physical exam Vitals:   03/27/16 0341 03/27/16 0818 03/27/16 1800 03/28/16 0537  BP: (!) 90/50 (!) 92/49 111/63 (!) 109/50  Pulse: (!) 55 (!) 53 64 68  Resp: 18 16 18 18   Temp: 98.2 F (36.8 C) 97.6 F (36.4 C) 97.9 F (36.6 C) 97.6 F (36.4 C)  TempSrc:  Axillary  Oral  SpO2: 96% 98%    Weight:      Height:       General: alert, cooperative and no distress Lochia: appropriate Uterine Fundus: firm Incision: No significant erythema, Dressing is clean, dry, and intact DVT Evaluation: No cords or calf tenderness. No significant calf/ankle edema. Labs: Lab Results  Component Value Date   WBC 13.3 (H) 03/27/2016   HGB 10.4 (L) 03/27/2016   HCT 29.0 (L) 03/27/2016   MCV 87.1 03/27/2016   PLT 260 03/27/2016   CMP Latest Ref Rng & Units 01/31/2013  Glucose 70 - 99 mg/dL 76  BUN 6 - 23 mg/dL 10  Creatinine 0.50 -  1.10 mg/dL 0.91  Sodium 135 - 145 mEq/L 137  Potassium 3.5 - 5.1 mEq/L 4.0  Chloride 96 - 112 mEq/L 103  CO2 19 - 32 mEq/L 22  Calcium 8.4 - 10.5 mg/dL 9.5  Total Protein 6.0 - 8.3 g/dL 6.2  Total Bilirubin 0.3 - 1.2 mg/dL 0.2(L)  Alkaline Phos 39 - 117 U/L 208(H)  AST 0 - 37 U/L 21  ALT 0 - 35 U/L 18    Discharge instruction: per After Visit Summary and "[facility] Booklet".  After visit meds:    Medication List    TAKE these medications   acetaminophen 325 MG tablet Commonly known as:  TYLENOL Take 2 tablets (650 mg total) by mouth every 4 (four) hours as needed (for pain scale < 4).   coconut oil Oil Apply 1 application topically as needed.   ibuprofen 600 MG tablet Commonly known as:  ADVIL,MOTRIN Take 1 tablet (600 mg total) by mouth every 6 (six) hours.   Oxycodone HCl 10 MG Tabs Take 1 tablet (10 mg total) by mouth every 4 (four) hours as needed (pain scale > 7).   prenatal multivitamin Tabs tablet Take 1 tablet by mouth daily at 12 noon.   senna-docusate 8.6-50 MG tablet Commonly known as:  Senokot-S Take  2 tablets by mouth daily. Start taking on:  03/29/2016   simethicone 80 MG chewable tablet Commonly known as:  MYLICON Chew 1 tablet (80 mg total) by mouth 3 (three) times daily after meals.       Diet: routine diet  Activity: Advance as tolerated. Pelvic rest for 6 weeks.   Outpatient follow up:6 weeks  Postpartum contraception: Not Discussed  Newborn Data: Live born female 57 Birth Weight: 8 lb 10.1 oz (3915 g) APGAR: 8, 9  Baby Feeding: Breast Disposition:home with mother   03/28/2016 Juliene Pina, CNM

## 2016-03-28 NOTE — Progress Notes (Signed)
Subjective: POD# 2 Information for the patient's newborn:  [place], [place] [id number]  female   circ completed Baby name: Hetty Blend  Reports feeling well, desires DC home.  Feeding: breast Patient reports tolerating PO.  Breast symptoms: none Pain controlled withacetaminophen and ibuprofen (OTC) Denies HA/SOB/C/P/N/V/dizziness. Flatus present, + BM. She reports vaginal bleeding as normal, without clots.  She is ambulating, urinating without difficulty.     Objective:   VS:    Vitals:   03/27/16 0341 03/27/16 0818 03/27/16 1800 03/28/16 0537  BP: (!) 90/50 (!) 92/49 111/63 (!) 109/50  Pulse: (!) 55 (!) 53 64 68  Resp: 18 16 18 18   Temp: 98.2 F (36.8 C) 97.6 F (36.4 C) 97.9 F (36.6 C) 97.6 F (36.4 C)  TempSrc:  Axillary  Oral  SpO2: 96% 98%    Weight:      Height:        No intake or output data in the 24 hours ending 03/28/16 1013      Recent Labs  03/27/16 0512  WBC 13.3*  HGB 10.4*  HCT 29.0*  PLT 260     Blood type: --/--/O POS (11/22 1223)  Rubella: Immune (05/10 0000)     Physical Exam:  General: alert, cooperative and no distress Abdomen: soft, nontender, normal bowel sounds Incision: dry, intact and mild ecchymosis Uterine Fundus: firm, below umbilicus, nontender Lochia: minimal Ext: no edema, redness or tenderness in the calves or thighs      Assessment/Plan: 35 y.o.   POD# 2. [id number]                  Principal Problem:   Postpartum care following cesarean delivery (11/25) Active Problems:   S/P repeat low transverse C-section   Doing well, stable.               Advance diet as tolerated Encourage rest when baby rests Breastfeeding support Encourage to ambulate Routine post-op care DC home today  Juliene Pina, CNM, MSN 03/28/2016, 10:13 AM

## 2016-03-28 NOTE — Lactation Note (Signed)
This note was copied from a baby's chart. Lactation Consultation Note  P2, Ex BF.  Baby latched while mother is standing up in room upon entering. Sucks and swallows observed.  Lips flanged. Mother states her nipples are beginning to get sore.  No trauma. Encouraged her to apply ebm or coconut oil and work on depth. Mother has hand pump at home. Mom encouraged to feed baby 8-12 times/24 hours and with feeding cues.  Reviewed engorgement care and monitoring voids/stools.   Patient Name: Boy Therma Righter [id number] Date: 03/28/2016 Reason for consult: Follow-up assessment   Maternal Data    Feeding Feeding Type: Breast Fed  LATCH Score/Interventions Latch: Grasps breast easily, tongue down, lips flanged, rhythmical sucking. (latched upon entering)  Audible Swallowing: Spontaneous and intermittent  Type of Nipple: Everted at rest and after stimulation  Comfort (Breast/Nipple): Filling, red/small blisters or bruises, mild/mod discomfort  Problem noted: Mild/Moderate discomfort Interventions (Mild/moderate discomfort): Hand expression  Hold (Positioning): No assistance needed to correctly position infant at breast.  LATCH Score: 9  Lactation Tools Discussed/Used     Consult Status Consult Status: Complete    Carlye Grippe 03/28/2016, 8:53 AM

## 2018-04-25 IMAGING — CR DG CHEST 2V
2 series · 2 of 2 positions shown · non-contrast
Comparison: 03/18/2016

CLINICAL DATA: Preop C-section.  Recent right lower lobe pneumonia.

EXAM:
CHEST  2 VIEW

[chest pa]
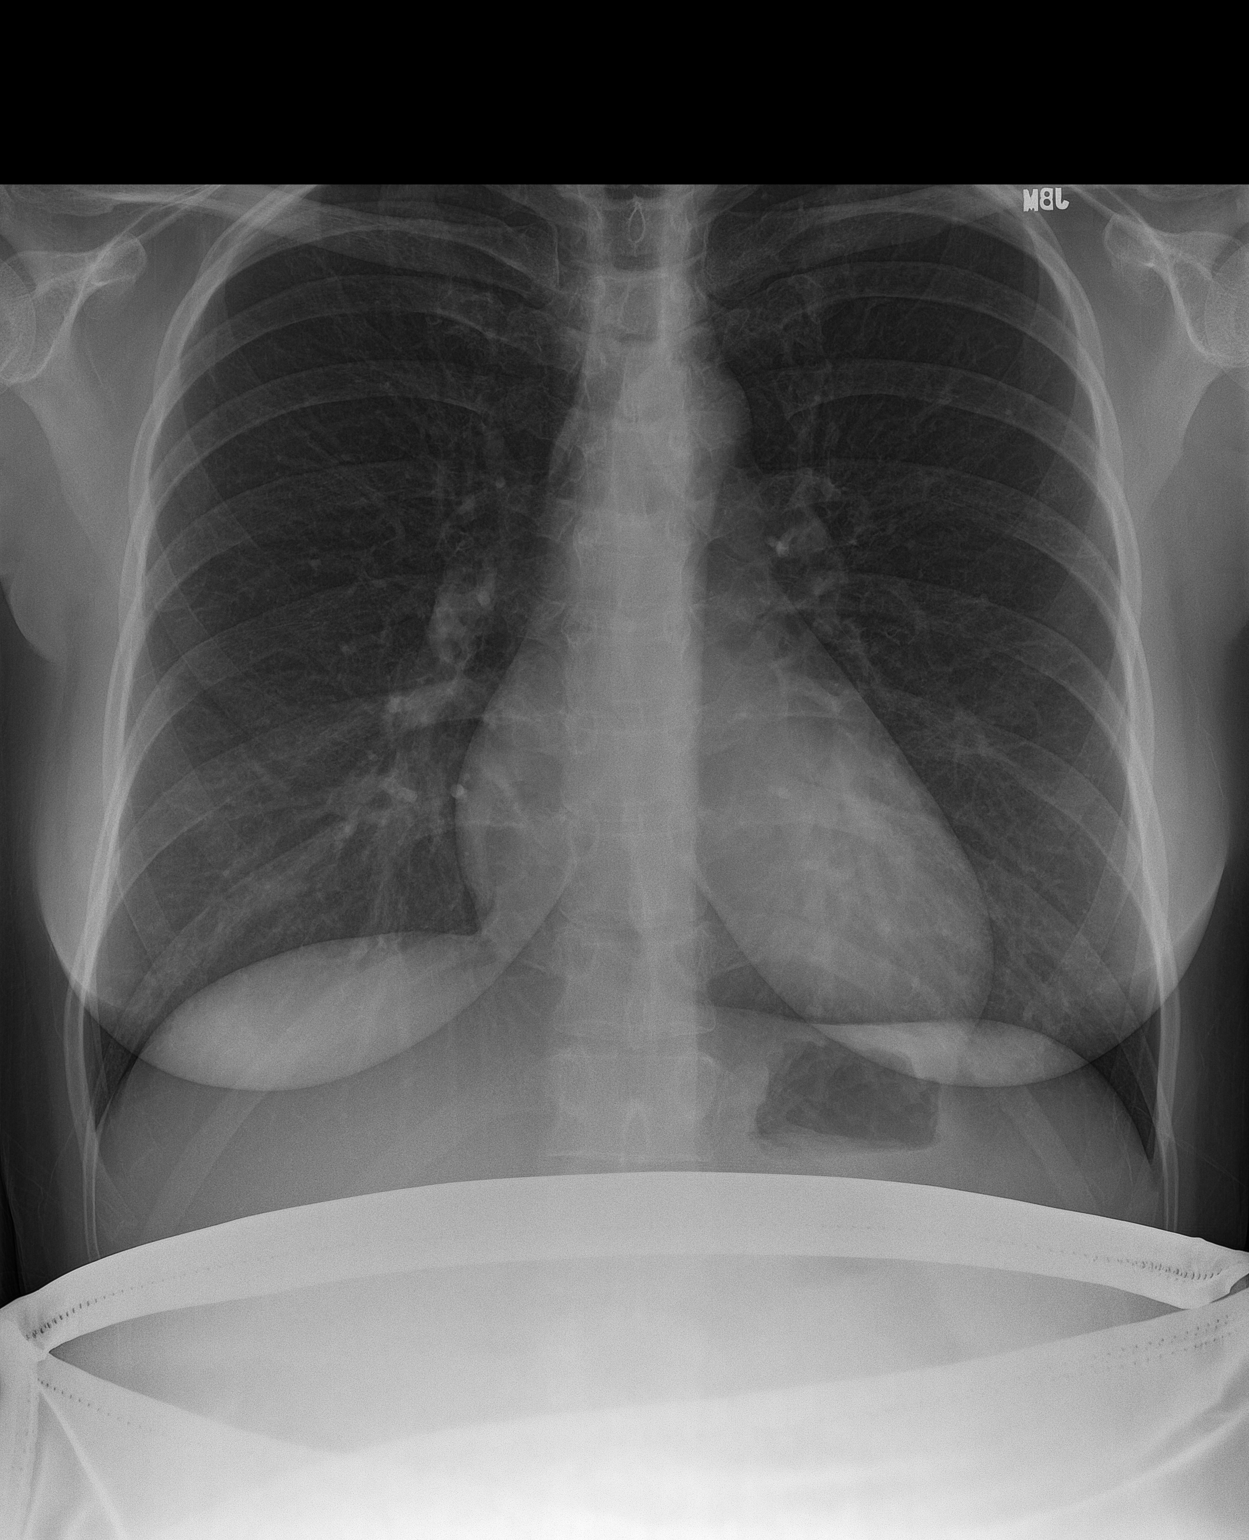

[chest lat]
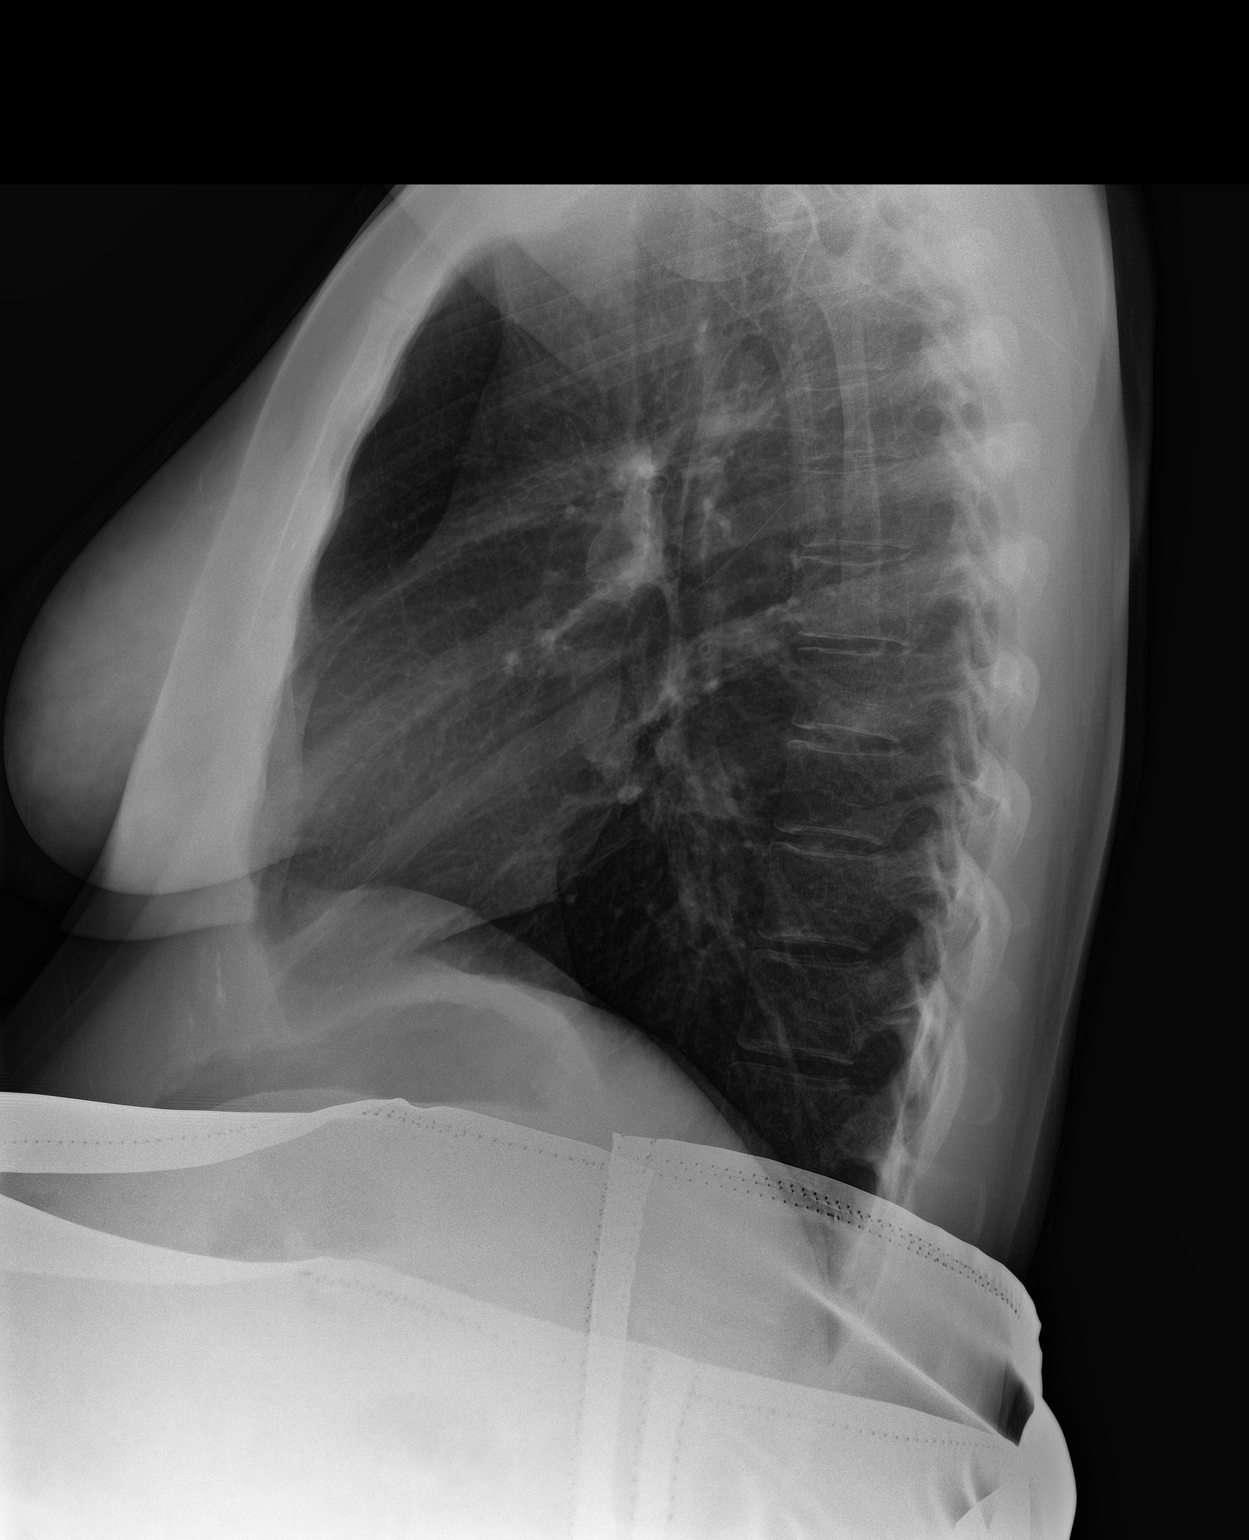

[2 of 2 positions shown; findings below may reference images not displayed]

FINDINGS: Interval clearance of the right lower lobe opacity since prior
study. No focal opacities or effusions. Heart is normal size. No
acute bony abnormality.
IMPRESSION: No active cardiopulmonary disease.
